# Patient Record
Sex: Male | Born: 1955 | Race: White | Hispanic: No | Marital: Married | Smoking: Former smoker
Health system: Southern US, Community
[De-identification: ages and names within clinical notes are randomized; demographics above are authoritative.]

## PROBLEM LIST (undated history)

## (undated) DIAGNOSIS — Z720 Tobacco use: Secondary | ICD-10-CM

## (undated) DIAGNOSIS — Z7289 Other problems related to lifestyle: Secondary | ICD-10-CM

## (undated) DIAGNOSIS — Z87891 Personal history of nicotine dependence: Secondary | ICD-10-CM

## (undated) DIAGNOSIS — F1090 Alcohol use, unspecified, uncomplicated: Secondary | ICD-10-CM

## (undated) HISTORY — PX: VASECTOMY: SHX75

---

## 1998-04-04 ENCOUNTER — Emergency Department (HOSPITAL_COMMUNITY): Admission: EM | Admit: 1998-04-04 | Discharge: 1998-04-04 | Payer: Self-pay | Admitting: Emergency Medicine

## 2011-05-21 DEATH — deceased

## 2020-05-21 ENCOUNTER — Emergency Department (HOSPITAL_COMMUNITY): Payer: HRSA Program

## 2020-05-21 ENCOUNTER — Other Ambulatory Visit: Payer: Self-pay

## 2020-05-21 ENCOUNTER — Inpatient Hospital Stay (HOSPITAL_COMMUNITY)
Admission: EM | Admit: 2020-05-21 | Discharge: 2020-05-26 | DRG: 177 | Disposition: A | Discharge status: Home / Self Care | Payer: HRSA Program | Attending: Internal Medicine | Admitting: Internal Medicine

## 2020-05-21 DIAGNOSIS — R32 Unspecified urinary incontinence: Secondary | ICD-10-CM | POA: Diagnosis present

## 2020-05-21 DIAGNOSIS — E860 Dehydration: Secondary | ICD-10-CM | POA: Diagnosis present

## 2020-05-21 DIAGNOSIS — N2889 Other specified disorders of kidney and ureter: Secondary | ICD-10-CM | POA: Diagnosis present

## 2020-05-21 DIAGNOSIS — E43 Unspecified severe protein-calorie malnutrition: Secondary | ICD-10-CM | POA: Diagnosis present

## 2020-05-21 DIAGNOSIS — R0602 Shortness of breath: Secondary | ICD-10-CM

## 2020-05-21 DIAGNOSIS — R197 Diarrhea, unspecified: Secondary | ICD-10-CM | POA: Diagnosis present

## 2020-05-21 DIAGNOSIS — R7401 Elevation of levels of liver transaminase levels: Secondary | ICD-10-CM | POA: Diagnosis present

## 2020-05-21 DIAGNOSIS — E8809 Other disorders of plasma-protein metabolism, not elsewhere classified: Secondary | ICD-10-CM | POA: Diagnosis present

## 2020-05-21 DIAGNOSIS — E878 Other disorders of electrolyte and fluid balance, not elsewhere classified: Secondary | ICD-10-CM | POA: Diagnosis present

## 2020-05-21 DIAGNOSIS — G9341 Metabolic encephalopathy: Secondary | ICD-10-CM | POA: Diagnosis present

## 2020-05-21 DIAGNOSIS — N179 Acute kidney failure, unspecified: Secondary | ICD-10-CM | POA: Diagnosis present

## 2020-05-21 DIAGNOSIS — E0781 Sick-euthyroid syndrome: Secondary | ICD-10-CM | POA: Diagnosis present

## 2020-05-21 DIAGNOSIS — Z87891 Personal history of nicotine dependence: Secondary | ICD-10-CM

## 2020-05-21 DIAGNOSIS — R0902 Hypoxemia: Secondary | ICD-10-CM

## 2020-05-21 DIAGNOSIS — R778 Other specified abnormalities of plasma proteins: Secondary | ICD-10-CM | POA: Diagnosis present

## 2020-05-21 DIAGNOSIS — R7989 Other specified abnormal findings of blood chemistry: Secondary | ICD-10-CM | POA: Diagnosis present

## 2020-05-21 DIAGNOSIS — U071 COVID-19: Secondary | ICD-10-CM

## 2020-05-21 DIAGNOSIS — J1282 Pneumonia due to coronavirus disease 2019: Secondary | ICD-10-CM | POA: Diagnosis present

## 2020-05-21 DIAGNOSIS — J9601 Acute respiratory failure with hypoxia: Secondary | ICD-10-CM | POA: Diagnosis present

## 2020-05-21 DIAGNOSIS — N1832 Chronic kidney disease, stage 3b: Secondary | ICD-10-CM | POA: Diagnosis present

## 2020-05-21 DIAGNOSIS — D509 Iron deficiency anemia, unspecified: Secondary | ICD-10-CM | POA: Diagnosis present

## 2020-05-21 DIAGNOSIS — N1330 Unspecified hydronephrosis: Secondary | ICD-10-CM | POA: Diagnosis present

## 2020-05-21 DIAGNOSIS — Z6821 Body mass index (BMI) 21.0-21.9, adult: Secondary | ICD-10-CM

## 2020-05-21 DIAGNOSIS — E871 Hypo-osmolality and hyponatremia: Secondary | ICD-10-CM | POA: Diagnosis present

## 2020-05-21 DIAGNOSIS — R319 Hematuria, unspecified: Secondary | ICD-10-CM | POA: Diagnosis present

## 2020-05-21 HISTORY — DX: Tobacco use: Z72.0

## 2020-05-21 HISTORY — DX: Alcohol use, unspecified, uncomplicated: F10.90

## 2020-05-21 HISTORY — DX: Other problems related to lifestyle: Z72.89

## 2020-05-21 HISTORY — DX: Personal history of nicotine dependence: Z87.891

## 2020-05-21 LAB — COMPREHENSIVE METABOLIC PANEL
ALT: 44 U/L (ref 0–44)
AST: 71 U/L — ABNORMAL HIGH (ref 15–41)
Albumin: 2.4 g/dL — ABNORMAL LOW (ref 3.5–5.0)
Alkaline Phosphatase: 36 U/L — ABNORMAL LOW (ref 38–126)
Anion gap: 13 (ref 5–15)
BUN: 47 mg/dL — ABNORMAL HIGH (ref 8–23)
CO2: 22 mmol/L (ref 22–32)
Calcium: 8.7 mg/dL — ABNORMAL LOW (ref 8.9–10.3)
Chloride: 94 mmol/L — ABNORMAL LOW (ref 98–111)
Creatinine, Ser: 2.63 mg/dL — ABNORMAL HIGH (ref 0.61–1.24)
GFR calc Af Amer: 28 mL/min — ABNORMAL LOW (ref >60)
GFR calc non Af Amer: 24 mL/min — ABNORMAL LOW (ref >60)
Glucose, Bld: 111 mg/dL — ABNORMAL HIGH (ref 70–99)
Potassium: 4.4 mmol/L (ref 3.5–5.1)
Sodium: 129 mmol/L — ABNORMAL LOW (ref 135–145)
Total Bilirubin: 0.9 mg/dL (ref 0.3–1.2)
Total Protein: 6.7 g/dL (ref 6.5–8.1)

## 2020-05-21 LAB — SARS CORONAVIRUS 2 BY RT PCR (HOSPITAL ORDER, PERFORMED IN ~~LOC~~ HOSPITAL LAB): SARS Coronavirus 2: POSITIVE — AB

## 2020-05-21 LAB — TROPONIN I (HIGH SENSITIVITY)
Troponin I (High Sensitivity): 26 ng/L — ABNORMAL HIGH (ref <18)
Troponin I (High Sensitivity): 30 ng/L — ABNORMAL HIGH (ref <18)

## 2020-05-21 NOTE — ED Notes (Signed)
Patient moved to appropriate waiting section

## 2020-05-21 NOTE — ED Triage Notes (Signed)
Patient concerned for Covid; he's been having cough and throat pain since 08/05; symptoms resolved and comes back.

## 2020-05-21 NOTE — ED Notes (Signed)
Patient stated his wife has given him some sort of OTC meds today, unknown if recently took tylenol.

## 2020-05-22 ENCOUNTER — Inpatient Hospital Stay (HOSPITAL_COMMUNITY): Payer: HRSA Program

## 2020-05-22 ENCOUNTER — Encounter (HOSPITAL_COMMUNITY): Payer: Self-pay | Admitting: Internal Medicine

## 2020-05-22 DIAGNOSIS — Z87891 Personal history of nicotine dependence: Secondary | ICD-10-CM | POA: Diagnosis not present

## 2020-05-22 DIAGNOSIS — J1282 Pneumonia due to coronavirus disease 2019: Secondary | ICD-10-CM

## 2020-05-22 DIAGNOSIS — D509 Iron deficiency anemia, unspecified: Secondary | ICD-10-CM

## 2020-05-22 DIAGNOSIS — E878 Other disorders of electrolyte and fluid balance, not elsewhere classified: Secondary | ICD-10-CM | POA: Diagnosis present

## 2020-05-22 DIAGNOSIS — J9601 Acute respiratory failure with hypoxia: Secondary | ICD-10-CM

## 2020-05-22 DIAGNOSIS — E871 Hypo-osmolality and hyponatremia: Secondary | ICD-10-CM | POA: Diagnosis present

## 2020-05-22 DIAGNOSIS — N390 Urinary tract infection, site not specified: Secondary | ICD-10-CM

## 2020-05-22 DIAGNOSIS — E8809 Other disorders of plasma-protein metabolism, not elsewhere classified: Secondary | ICD-10-CM | POA: Diagnosis present

## 2020-05-22 DIAGNOSIS — E0781 Sick-euthyroid syndrome: Secondary | ICD-10-CM | POA: Diagnosis present

## 2020-05-22 DIAGNOSIS — R778 Other specified abnormalities of plasma proteins: Secondary | ICD-10-CM | POA: Diagnosis present

## 2020-05-22 DIAGNOSIS — R32 Unspecified urinary incontinence: Secondary | ICD-10-CM | POA: Diagnosis present

## 2020-05-22 DIAGNOSIS — R7989 Other specified abnormal findings of blood chemistry: Secondary | ICD-10-CM | POA: Diagnosis not present

## 2020-05-22 DIAGNOSIS — R319 Hematuria, unspecified: Secondary | ICD-10-CM | POA: Diagnosis present

## 2020-05-22 DIAGNOSIS — U071 COVID-19: Secondary | ICD-10-CM | POA: Diagnosis present

## 2020-05-22 DIAGNOSIS — E860 Dehydration: Secondary | ICD-10-CM | POA: Diagnosis present

## 2020-05-22 DIAGNOSIS — N1832 Chronic kidney disease, stage 3b: Secondary | ICD-10-CM | POA: Diagnosis present

## 2020-05-22 DIAGNOSIS — N179 Acute kidney failure, unspecified: Secondary | ICD-10-CM | POA: Diagnosis present

## 2020-05-22 DIAGNOSIS — R7401 Elevation of levels of liver transaminase levels: Secondary | ICD-10-CM

## 2020-05-22 DIAGNOSIS — N1330 Unspecified hydronephrosis: Secondary | ICD-10-CM | POA: Diagnosis present

## 2020-05-22 DIAGNOSIS — N2889 Other specified disorders of kidney and ureter: Secondary | ICD-10-CM | POA: Diagnosis present

## 2020-05-22 DIAGNOSIS — I5031 Acute diastolic (congestive) heart failure: Secondary | ICD-10-CM | POA: Diagnosis not present

## 2020-05-22 DIAGNOSIS — R197 Diarrhea, unspecified: Secondary | ICD-10-CM | POA: Diagnosis present

## 2020-05-22 DIAGNOSIS — Z6821 Body mass index (BMI) 21.0-21.9, adult: Secondary | ICD-10-CM | POA: Diagnosis not present

## 2020-05-22 DIAGNOSIS — E43 Unspecified severe protein-calorie malnutrition: Secondary | ICD-10-CM

## 2020-05-22 DIAGNOSIS — G9341 Metabolic encephalopathy: Secondary | ICD-10-CM | POA: Diagnosis present

## 2020-05-22 LAB — CBC WITH DIFFERENTIAL/PLATELET
Abs Immature Granulocytes: 0 10*3/uL (ref 0.00–0.07)
Basophils Absolute: 0 10*3/uL (ref 0.0–0.1)
Basophils Relative: 0 %
Eosinophils Absolute: 0 10*3/uL (ref 0.0–0.5)
Eosinophils Relative: 0 %
HCT: 33.9 % — ABNORMAL LOW (ref 39.0–52.0)
Hemoglobin: 11 g/dL — ABNORMAL LOW (ref 13.0–17.0)
Lymphocytes Relative: 1 %
Lymphs Abs: 0.1 10*3/uL — ABNORMAL LOW (ref 0.7–4.0)
MCH: 25.6 pg — ABNORMAL LOW (ref 26.0–34.0)
MCHC: 32.4 g/dL (ref 30.0–36.0)
MCV: 78.8 fL — ABNORMAL LOW (ref 80.0–100.0)
Monocytes Absolute: 0.1 10*3/uL (ref 0.1–1.0)
Monocytes Relative: 1 %
Neutro Abs: 6.6 10*3/uL (ref 1.7–7.7)
Neutrophils Relative %: 98 %
Platelets: 358 10*3/uL (ref 150–400)
RBC: 4.3 MIL/uL (ref 4.22–5.81)
RDW: 15.4 % (ref 11.5–15.5)
WBC: 6.7 10*3/uL (ref 4.0–10.5)
nRBC: 0 % (ref 0.0–0.2)
nRBC: 0 /100 WBC

## 2020-05-22 LAB — LACTATE DEHYDROGENASE: LDH: 404 U/L — ABNORMAL HIGH (ref 98–192)

## 2020-05-22 LAB — FIBRINOGEN: Fibrinogen: 792 mg/dL — ABNORMAL HIGH (ref 210–475)

## 2020-05-22 LAB — LACTIC ACID, PLASMA: Lactic Acid, Venous: 1.7 mmol/L (ref 0.5–1.9)

## 2020-05-22 LAB — URINALYSIS, ROUTINE W REFLEX MICROSCOPIC
Bilirubin Urine: NEGATIVE
Glucose, UA: NEGATIVE mg/dL
Ketones, ur: 5 mg/dL — AB
Leukocytes,Ua: NEGATIVE
Nitrite: NEGATIVE
Protein, ur: 100 mg/dL — AB
RBC / HPF: >50 RBC/hpf — ABNORMAL HIGH (ref 0–5)
Specific Gravity, Urine: 1.013 (ref 1.005–1.030)
pH: 5 (ref 5.0–8.0)

## 2020-05-22 LAB — ABO/RH: ABO/RH(D): O POS

## 2020-05-22 LAB — HIV ANTIBODY (ROUTINE TESTING W REFLEX): HIV Screen 4th Generation wRfx: NONREACTIVE

## 2020-05-22 LAB — STREP PNEUMONIAE URINARY ANTIGEN: Strep Pneumo Urinary Antigen: NEGATIVE

## 2020-05-22 LAB — SODIUM, URINE, RANDOM: Sodium, Ur: 12 mmol/L

## 2020-05-22 LAB — CK: Total CK: 1116 U/L — ABNORMAL HIGH (ref 49–397)

## 2020-05-22 LAB — FERRITIN: Ferritin: 967 ng/mL — ABNORMAL HIGH (ref 24–336)

## 2020-05-22 LAB — D-DIMER, QUANTITATIVE: D-Dimer, Quant: 4.31 ug/mL-FEU — ABNORMAL HIGH (ref 0.00–0.50)

## 2020-05-22 LAB — PROCALCITONIN: Procalcitonin: 1.38 ng/mL

## 2020-05-22 LAB — C-REACTIVE PROTEIN: CRP: 22.5 mg/dL — ABNORMAL HIGH (ref <1.0)

## 2020-05-22 LAB — HEPATITIS B SURFACE ANTIGEN: Hepatitis B Surface Ag: NONREACTIVE

## 2020-05-22 LAB — CREATININE, URINE, RANDOM: Creatinine, Urine: 158.38 mg/dL

## 2020-05-22 LAB — BRAIN NATRIURETIC PEPTIDE: B Natriuretic Peptide: 62.9 pg/mL (ref 0.0–100.0)

## 2020-05-22 LAB — PREALBUMIN: Prealbumin: <5 mg/dL — ABNORMAL LOW (ref 18–38)

## 2020-05-22 MED ORDER — ZINC SULFATE 220 (50 ZN) MG PO CAPS
220.0000 mg | ORAL_CAPSULE | Freq: Every day | ORAL | Status: DC
  Administered 2020-05-22 – 2020-05-26 (×5): 220 mg via ORAL
  Filled 2020-05-22 (×5): qty 1

## 2020-05-22 MED ORDER — HEPARIN SODIUM (PORCINE) 5000 UNIT/ML IJ SOLN
5000.0000 [IU] | Freq: Three times a day (TID) | INTRAMUSCULAR | Status: DC
  Administered 2020-05-22 – 2020-05-26 (×14): 5000 [IU] via SUBCUTANEOUS
  Filled 2020-05-22 (×15): qty 1

## 2020-05-22 MED ORDER — AZITHROMYCIN 500 MG PO TABS
500.0000 mg | ORAL_TABLET | Freq: Every day | ORAL | Status: DC
  Administered 2020-05-22 – 2020-05-24 (×3): 500 mg via ORAL
  Filled 2020-05-22 (×2): qty 1
  Filled 2020-05-22: qty 2

## 2020-05-22 MED ORDER — GUAIFENESIN-DM 100-10 MG/5ML PO SYRP: 10.0000 mL | ORAL_SOLUTION | ORAL | Status: DC | PRN

## 2020-05-22 MED ORDER — FAMOTIDINE IN NACL 20-0.9 MG/50ML-% IV SOLN: 20.0000 mg | Freq: Once | INTRAVENOUS | Status: DC | PRN

## 2020-05-22 MED ORDER — ONDANSETRON HCL 4 MG PO TABS: 4.0000 mg | ORAL_TABLET | Freq: Four times a day (QID) | ORAL | Status: DC | PRN

## 2020-05-22 MED ORDER — ALBUTEROL SULFATE HFA 108 (90 BASE) MCG/ACT IN AERS
4.0000 | INHALATION_SPRAY | Freq: Once | RESPIRATORY_TRACT | Status: AC
  Administered 2020-05-22: 4 via RESPIRATORY_TRACT
  Filled 2020-05-22: qty 6.7

## 2020-05-22 MED ORDER — SODIUM CHLORIDE 0.9 % IV SOLN: INTRAVENOUS | Status: DC | PRN

## 2020-05-22 MED ORDER — ALBUTEROL SULFATE HFA 108 (90 BASE) MCG/ACT IN AERS: 2.0000 | INHALATION_SPRAY | Freq: Once | RESPIRATORY_TRACT | Status: DC | PRN

## 2020-05-22 MED ORDER — METHYLPREDNISOLONE SODIUM SUCC 125 MG IJ SOLR: 125.0000 mg | Freq: Once | INTRAMUSCULAR | Status: DC | PRN

## 2020-05-22 MED ORDER — LACTATED RINGERS IV BOLUS
1000.0000 mL | Freq: Once | INTRAVENOUS | Status: AC
  Administered 2020-05-22: 1000 mL via INTRAVENOUS

## 2020-05-22 MED ORDER — SODIUM CHLORIDE 0.9 % IV SOLN
100.0000 mg | Freq: Every day | INTRAVENOUS | Status: AC
  Administered 2020-05-23 – 2020-05-26 (×4): 100 mg via INTRAVENOUS
  Filled 2020-05-22 (×5): qty 20

## 2020-05-22 MED ORDER — ACETAMINOPHEN 325 MG PO TABS: 650.0000 mg | ORAL_TABLET | Freq: Four times a day (QID) | ORAL | Status: DC | PRN

## 2020-05-22 MED ORDER — SODIUM CHLORIDE 0.9 % IV SOLN
2.0000 g | INTRAVENOUS | Status: DC
  Administered 2020-05-22 – 2020-05-24 (×3): 2 g via INTRAVENOUS
  Filled 2020-05-22 (×3): qty 20

## 2020-05-22 MED ORDER — SODIUM CHLORIDE 0.9 % IV SOLN: Freq: Once | INTRAVENOUS | Status: AC

## 2020-05-22 MED ORDER — ONDANSETRON HCL 4 MG/2ML IJ SOLN: 4.0000 mg | Freq: Four times a day (QID) | INTRAMUSCULAR | Status: DC | PRN

## 2020-05-22 MED ORDER — METHYLPREDNISOLONE SODIUM SUCC 40 MG IJ SOLR
0.2500 mg/kg | Freq: Two times a day (BID) | INTRAMUSCULAR | Status: DC
  Administered 2020-05-22 (×2): 17.2 mg via INTRAVENOUS
  Filled 2020-05-22 (×2): qty 1

## 2020-05-22 MED ORDER — SODIUM CHLORIDE 0.9 % IV SOLN
200.0000 mg | Freq: Once | INTRAVENOUS | Status: AC
  Administered 2020-05-22: 200 mg via INTRAVENOUS
  Filled 2020-05-22: qty 40

## 2020-05-22 MED ORDER — HYDROCOD POLST-CPM POLST ER 10-8 MG/5ML PO SUER: 5.0000 mL | Freq: Two times a day (BID) | ORAL | Status: DC | PRN

## 2020-05-22 MED ORDER — FAMOTIDINE 20 MG PO TABS
20.0000 mg | ORAL_TABLET | Freq: Two times a day (BID) | ORAL | Status: DC
  Administered 2020-05-22 – 2020-05-23 (×3): 20 mg via ORAL
  Filled 2020-05-22 (×3): qty 1

## 2020-05-22 MED ORDER — SODIUM CHLORIDE 0.9% FLUSH
3.0000 mL | Freq: Two times a day (BID) | INTRAVENOUS | Status: DC
  Administered 2020-05-22 – 2020-05-26 (×8): 3 mL via INTRAVENOUS

## 2020-05-22 MED ORDER — DIPHENHYDRAMINE HCL 50 MG/ML IJ SOLN: 50.0000 mg | Freq: Once | INTRAMUSCULAR | Status: DC | PRN

## 2020-05-22 MED ORDER — ALBUTEROL SULFATE HFA 108 (90 BASE) MCG/ACT IN AERS
2.0000 | INHALATION_SPRAY | Freq: Four times a day (QID) | RESPIRATORY_TRACT | Status: DC
  Administered 2020-05-22 – 2020-05-26 (×17): 2 via RESPIRATORY_TRACT
  Filled 2020-05-22 (×2): qty 6.7

## 2020-05-22 MED ORDER — ENSURE ENLIVE PO LIQD
237.0000 mL | Freq: Two times a day (BID) | ORAL | Status: DC
  Administered 2020-05-22 – 2020-05-25 (×5): 237 mL via ORAL
  Filled 2020-05-22: qty 237

## 2020-05-22 MED ORDER — EPINEPHRINE 0.3 MG/0.3ML IJ SOAJ: 0.3000 mg | Freq: Once | INTRAMUSCULAR | Status: DC | PRN

## 2020-05-22 MED ORDER — SODIUM CHLORIDE 0.9 % IV SOLN
1200.0000 mg | Freq: Once | INTRAVENOUS | Status: AC
  Administered 2020-05-22: 1200 mg via INTRAVENOUS
  Filled 2020-05-22: qty 10

## 2020-05-22 MED ORDER — ASCORBIC ACID 500 MG PO TABS
500.0000 mg | ORAL_TABLET | Freq: Every day | ORAL | Status: DC
  Administered 2020-05-22 – 2020-05-26 (×5): 500 mg via ORAL
  Filled 2020-05-22 (×5): qty 1

## 2020-05-22 MED ORDER — TECHNETIUM TO 99M ALBUMIN AGGREGATED
4.4000 | Freq: Once | INTRAVENOUS | Status: AC | PRN
  Administered 2020-05-22: 4.4 via INTRAVENOUS

## 2020-05-22 NOTE — Progress Notes (Addendum)
Renal ultrasound revealed signs of a left-sided renal mass with signs of hydronephrosis.  Discussed case with Dr. Gloriann Loan who recommended starting out with placing a Foley catheter as there was concern for some bladder distention.  If no improvement thereafter would recommended obtaining CT scan of the abdomen pelvis without contrast.  In regards to the renal mass nothing to do in the acute setting, but ultimately will need outpatient follow-up, CT/MRI, and nephrectomy .  Wife and patient updated in regards to these acute finding. CK was elevated up 1116 concern for mild rhabdomyolysis.  Currently only giving gentle IV fluid rehydration given COVID-19 status.  Recheck CK in a.m.

## 2020-05-22 NOTE — Progress Notes (Signed)
I was called for recommendations in regards to this patient.  Patient currently being admitted with COVID-19.  He was found to have acute renal insufficiency with a creatinine of 2.63 and therefore a renal ultrasound was performed.  His main complaints are related to COVID-19 including worsening cough and weakness and was treating himself with ivermectin.  He was also fasting without much p.o. intake.  I was personally called in relation to his renal ultrasound which revealed minimal right hydronephrosis and prominence of the proximal ureter in the left kidney, there was a heterogeneous solid mass measuring 11.2 x 9.4 x 12 cm with mild to moderate left hydronephrosis and proximal hydroureter with heterogeneous fluid in the renal collecting system potentially consistent with blood product from this large renal mass.  He also had increased renal parenchymal echogenicity.  The bladder appeared distended with debris in the bladder.  I would currently recommend placement of Foley catheter and observation of his renal function while he is treated for COVID-19.  No acute intervention necessary for the large renal mass however, once he has improved, he will need dedicated renal imaging either with a CT or an MRI.  Continue to monitor renal function with Foley in place and once he clinically improves, recommend voiding trial.  Please call with any questions or concerns.  I would hold off on CT for now unless renal function worsens.

## 2020-05-22 NOTE — ED Provider Notes (Signed)
[name] Provider Note   CSN: [id number] Arrival date & time: 05/21/20  1823     History Chief Complaint  Patient presents with  . Cough    COVID+    [name] is a 64 y.o. male.   Cough Cough characteristics:  Dry and harsh Severity:  Moderate Onset quality:  Gradual Duration:  2 days Timing:  Constant Progression:  Worsening Chronicity:  New Smoker: previous.   Context: sick contacts and upper respiratory infection   Context: not with activity   Relieved by:  Cough suppressants Worsened by:  Nothing Ineffective treatments:  None tried Associated symptoms: chest pain, chills, fever, myalgias and shortness of breath      No past medical history on file.  Patient Active Problem List   Diagnosis Date Noted  . Pneumonia due to COVID-19 virus 05/22/2020    No family history on file.  Social History   Tobacco Use  . Smoking status: Not on file  Substance Use Topics  . Alcohol use: Not on file  . Drug use: Not on file    Home Medications Prior to Admission medications   Not on File    Allergies    Patient has no known allergies.  Review of Systems   Review of Systems  Constitutional: Positive for chills and fever.  Respiratory: Positive for cough and shortness of breath.   Cardiovascular: Positive for chest pain.  Musculoskeletal: Positive for myalgias.  All other systems reviewed and are negative.   Physical Exam Updated Vital Signs BP 117/65 (BP Location: Left Arm)   Pulse 84   Temp 98.8 F (37.1 C)   Resp (!) 22   SpO2 94%   Physical Exam Vitals and nursing note reviewed.  Constitutional:      Appearance: He is well-developed.  HENT:     Head: Normocephalic and atraumatic.     Nose: Nose normal. No congestion or rhinorrhea.     Mouth/Throat:     Mouth: Mucous membranes are moist.  Eyes:     Pupils: Pupils are equal, round, and reactive to light.  Cardiovascular:     Rate and Rhythm:  Normal rate.  Pulmonary:     Effort: Pulmonary effort is normal. Tachypnea present. No respiratory distress.     Breath sounds: Rales present.  Abdominal:     General: There is no distension.  Musculoskeletal:        General: No swelling or tenderness. Normal range of motion.     Cervical back: Normal range of motion.  Skin:    General: Skin is warm and dry.  Neurological:     General: No focal deficit present.     Mental Status: He is alert.     ED Results / Procedures / Treatments   Labs (all labs ordered are listed, but only abnormal results are displayed) Labs Reviewed  SARS CORONAVIRUS 2 BY RT PCR ([facility]) - Abnormal; Notable for the following components:      Result Value   SARS Coronavirus 2 POSITIVE (*)    All other components within normal limits  CBC WITH DIFFERENTIAL/PLATELET - Abnormal; Notable for the following components:   Hemoglobin 11.0 (*)    HCT 33.9 (*)    MCV 78.8 (*)    MCH 25.6 (*)    Lymphs Abs 0.1 (*)    All other components within normal limits  COMPREHENSIVE METABOLIC PANEL - Abnormal; Notable for the following  components:   Sodium 129 (*)    Chloride 94 (*)    Glucose, Bld 111 (*)    BUN 47 (*)    Creatinine, Ser 2.63 (*)    Calcium 8.7 (*)    Albumin 2.4 (*)    AST 71 (*)    Alkaline Phosphatase 36 (*)    GFR calc non Af Amer 24 (*)    GFR calc Af Amer 28 (*)    All other components within normal limits  TROPONIN I (HIGH SENSITIVITY) - Abnormal; Notable for the following components:   Troponin I (High Sensitivity) 26 (*)    All other components within normal limits  TROPONIN I (HIGH SENSITIVITY) - Abnormal; Notable for the following components:   Troponin I (High Sensitivity) 30 (*)    All other components within normal limits    EKG None  Radiology DG Chest Portable 1 View  Result Date: 05/21/2020 CLINICAL DATA:  Cough and throat pain. COVID positive. EXAM: PORTABLE CHEST 1 VIEW  COMPARISON:  None. FINDINGS: Very mild hazy areas of early infiltrate and/or atelectasis are seen along the periphery of the mid and lower left lung there is no evidence of a pleural effusion or pneumothorax. The heart size and mediastinal contours are within normal limits. The visualized skeletal structures are unremarkable. IMPRESSION: Very mild mid and lower left lung infiltrate and/or atelectasis. Electronically Signed   By: Virgina Norfolk M.D.   On: 05/21/2020 23:06    Procedures .Critical Care Performed by: Merrily Pew, MD Authorized by: Merrily Pew, MD   Critical care provider statement:    Critical care time (minutes):  45   Critical care was necessary to treat or prevent imminent or life-threatening deterioration of the following conditions:  Respiratory failure   Critical care was time spent personally by me on the following activities:  Discussions with consultants, evaluation of patient's response to treatment, examination of patient, ordering and performing treatments and interventions, ordering and review of laboratory studies, ordering and review of radiographic studies, pulse oximetry, re-evaluation of patient's condition, obtaining history from patient or surrogate and review of old charts   (including critical care time)  Medications Ordered in ED Medications  0.9 %  sodium chloride infusion (has no administration in time range)  diphenhydrAMINE (BENADRYL) injection 50 mg (has no administration in time range)  famotidine (PEPCID) IVPB 20 mg premix (has no administration in time range)  methylPREDNISolone sodium succinate (SOLU-MEDROL) 125 mg/2 mL injection 125 mg (has no administration in time range)  albuterol (VENTOLIN HFA) 108 (90 Base) MCG/ACT inhaler 2 puff (has no administration in time range)  EPINEPHrine (EPI-PEN) injection 0.3 mg (has no administration in time range)  lactated ringers bolus 1,000 mL (0 mLs Intravenous Stopped 05/22/20 0648)  albuterol (VENTOLIN  HFA) 108 (90 Base) MCG/ACT inhaler 4 puff (4 puffs Inhalation Given 05/22/20 0542)  casirivimab-imdevimab (REGEN-COV) 1,200 mg in sodium chloride 0.9 % 110 mL IVPB (0 mg Intravenous Stopped 05/22/20 4098)    ED Course  I have reviewed the triage vital signs and the nursing notes.  Pertinent labs & imaging results that were available during my care of the patient were reviewed by me and considered in my medical decision making (see chart for details).    MDM Rules/Calculators/A&P                          Patient is unvaccinated and has Covid.  He is tachypneic not speak in full  sentences, oxygen as low as 89% while speaking 92% at rest.  Has rales in his lungs.  He has elevated troponin likely secondary to creatinine disease but also there is no baseline to draw either 1 of those from.  His kidney function is significantly reduced but once again I do not know what the chronicity of that is.  I discussed options with him for treatment.  Patient is hesitant to be admitted to the hospital as he wants to take Plaquenil at home.  I advised him this is not a suggested regimen for Covid however he is skeptical of my suggestion.  He does agree to try some albuterol, fluids and monoclonal antibody infusion here in the emergency room depending how he feels and his response to that he will decide on whether he wants to be admitted or discharged but he is leaning towards discharge.  I discussed with him how early this was in his illness and how much he could worsen and he understands that while making these decisions. Will recheck later for ultimate disposition.   On reevaluation with his wife on speaker phone patient finally agrees to be admitted.  I tried take his oxygen off and he got significantly more tachypneic and his oxygen pretty much immediately dropped to the low nineties dependent 8988 range at rest while talking.  He understood that this is a severe problem now and is willing to stay for treatment.  Also  his wife states that he was very lethargic and seemed altered.  Final Clinical Impression(s) / ED Diagnoses Final diagnoses:  COVID-19  Hypoxia  Acute renal failure, unspecified acute renal failure type [facility])    Rx / DC Orders ED Discharge Orders    None       Garry Bochicchio, Corene Cornea, MD 05/22/20 [phone]

## 2020-05-22 NOTE — H&P (Addendum)
History and Physical    [name] PXT:[id number] DOB: [birth year] DOA: 05/21/2020  Referring MD/NP/PA: Merrily Pew, MD PCP: Patient, No Pcp Per  Patient coming from: Home  Chief Complaint: Cough and weakness  I have personally briefly reviewed patient's old medical records in [place]   HPI: [name] is a 63 y.o. male with  past medical history significant for alcohol and vape use presented with complaints of worsening cough and weakness over the last 5-6 week.  Patient seems to be somewhat of a poor historian when it comes to the exact dates time and his wife feels in blanks over the phone.  He had not received any of his COVID-19 vaccinations.  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  He complains of having a productive cough for which she had been using Mucinex intermittently without any improvement in symptoms.  Reports having blood in his urine, bowel and urinary  incontinence, left buttock pain, fell at least once, confused, and diarrhea.  He reports taking ivermectin 1 ml for 2 days and had also been fasting as a form of agent remedy.  Per his wife he had been more confused and his oxygen levels were dropping down to 86% at home which she brought him to the hospital for further evaluation.  Normally drinks 1 or 2 beers per day on average, but has not had a drink since he has been sick.  ED Course: Upon admission into the emergency department patient was noted to be febrile up to 101.9 F with mild tachypnea and O2 saturations noted to be as low as 89% while speaking which he was placed on 2 L of nasal cannula oxygen.  Labs significant for hemoglobin 11, sodium 129, BUN 47, creatinine 2.67, albumin 2.4, and AST 71.  Chest x-ray revealed mild mid and lower infiltrates or atelectasis on the left lung.  Plan was initially to possibly discharge patient home and he was given Regen-cov,1 L normal saline IV  fluids, and albuterol inhaler.  However, due to patient's respiratory status admission was recommended.  Review of Systems  Constitutional: Positive for chills and fever.  HENT: Positive for congestion. Negative for nosebleeds.   Eyes: Negative for photophobia and pain.  Respiratory: Positive for cough and shortness of breath. Negative for wheezing.   Cardiovascular: Positive for chest pain. Negative for leg swelling.  Gastrointestinal: Positive for diarrhea. Negative for nausea.  Genitourinary: Positive for hematuria. Negative for flank pain.  Musculoskeletal: Positive for falls and myalgias.  Skin: Negative for itching.  Neurological: Positive for weakness. Negative for focal weakness.  Psychiatric/Behavioral: Positive for substance abuse.       Positive for confusion    Past Medical History:  Diagnosis Date  . Alcohol use   . History of tobacco use   . Vapes nicotine containing substance     Past Surgical History:  Procedure Laterality Date  . VASECTOMY       reports that he quit smoking about 6 years ago. His smoking use included cigarettes. He has never used smokeless tobacco. He reports current alcohol use of about 14.0 standard drinks of alcohol per week. No history on file for drug use.  No Known Allergies  History reviewed. No pertinent family history as he reports family was very secretive regarding medical problems.  Prior to Admission medications   Not on File    Physical Exam:  Constitutional: Elderly male who  appears to be acutely ill Vitals:   05/22/20 0338 05/22/20 0543 05/22/20 0621 05/22/20 0648  BP: 105/64 112/67 110/75 117/65  Pulse: 89 89 94 84  Resp: 18 20 16  (!) 22  Temp:    98.8 F (37.1 C)  TempSrc:      SpO2: 93% 94% 96% 94%   Eyes: PERRL, lids and conjunctivae normal ENMT: Mucous membranes are dry.  Posterior pharynx clear of any exudate or lesions.  Neck: normal, supple, no masses, no thyromegaly Respiratory: Tachypneic with scattered  crackles appreciated in both lung fields.  Patient currently on 3 L nasal cannula oxygen with O2 saturations currently 94%. Cardiovascular: Regular rate and rhythm, no murmurs / rubs / gallops. No extremity edema. 2+ pedal pulses. No carotid bruits.  Abdomen: no tenderness, no masses palpated. No hepatosplenomegaly. Bowel sounds positive.  Musculoskeletal: no clubbing / cyanosis. No joint deformity upper and lower extremities. Good ROM, no contractures. Normal muscle tone.  Skin: no rashes, lesions, ulcers. No induration Neurologic: CN 2-12 grossly intact. Sensation intact, DTR normal. Strength 5/5 in all 4.  Psychiatric: Alert and oriented x person and place.  He does appear to be intermittently confused.    Labs on Admission: I have personally reviewed following labs and imaging studies  CBC: Recent Labs  Lab 05/21/20 1929  WBC 6.7  NEUTROABS 6.6  HGB 11.0*  HCT 33.9*  MCV 78.8*  PLT 665   Basic Metabolic Panel: Recent Labs  Lab 05/21/20 1929  NA 129*  K 4.4  CL 94*  CO2 22  GLUCOSE 111*  BUN 47*  CREATININE 2.63*  CALCIUM 8.7*   GFR: CrCl cannot be calculated (Unknown ideal weight.). Liver Function Tests: Recent Labs  Lab 05/21/20 1929  AST 71*  ALT 44  ALKPHOS 36*  BILITOT 0.9  PROT 6.7  ALBUMIN 2.4*   No results for input(s): LIPASE, AMYLASE in the last 168 hours. No results for input(s): AMMONIA in the last 168 hours. Coagulation Profile: No results for input(s): INR, PROTIME in the last 168 hours. Cardiac Enzymes: No results for input(s): CKTOTAL, CKMB, CKMBINDEX, TROPONINI in the last 168 hours. BNP (last 3 results) No results for input(s): PROBNP in the last 8760 hours. HbA1C: No results for input(s): HGBA1C in the last 72 hours. CBG: No results for input(s): GLUCAP in the last 168 hours. Lipid Profile: No results for input(s): CHOL, HDL, LDLCALC, TRIG, CHOLHDL, LDLDIRECT in the last 72 hours. Thyroid Function Tests: No results for input(s):  TSH, T4TOTAL, FREET4, T3FREE, THYROIDAB in the last 72 hours. Anemia Panel: No results for input(s): VITAMINB12, FOLATE, FERRITIN, TIBC, IRON, RETICCTPCT in the last 72 hours. Urine analysis: No results found for: COLORURINE, APPEARANCEUR, LABSPEC, PHURINE, GLUCOSEU, HGBUR, BILIRUBINUR, KETONESUR, PROTEINUR, UROBILINOGEN, NITRITE, LEUKOCYTESUR Sepsis Labs: Recent Results (from the past 240 hour(s))  SARS Coronavirus 2 by RT PCR (hospital order, performed in [facility] hospital lab) Nasopharyngeal Nasopharyngeal Swab     Status: Abnormal   Collection Time: 05/21/20  7:15 PM   Specimen: Nasopharyngeal Swab  Result Value Ref Range Status   SARS Coronavirus 2 POSITIVE (A) NEGATIVE Final    Comment: RESULT CALLED TO, READ BACK BY AND VERIFIED WITH: K,MUNNETT RN @2109  05/21/20 EB (NOTE) SARS-CoV-2 target nucleic acids are DETECTED  SARS-CoV-2 RNA is generally detectable in upper respiratory specimens  during the acute phase of infection.  Positive results are indicative  of the presence of the identified virus, but do not rule out bacterial infection or co-infection with other pathogens  not detected by the test.  Clinical correlation with patient history and  other diagnostic information is necessary to determine patient infection status.  The expected result is negative.  Fact Sheet for Patients:   StrictlyIdeas.no   Fact Sheet for Healthcare Providers:   BankingDealers.co.za    This test is not yet approved or cleared by the [place] FDA and  has been authorized for detection and/or diagnosis of SARS-CoV-2 by FDA under an Emergency Use Authorization (EUA).  This EUA will remain in effect (meaning this test can  be used) for the duration of  the COVID-19 declaration under Section 564(b)(1) of the Act, 21 U.S.C. section 360-bbb-3(b)(1), unless the authorization is terminated or revoked sooner.  Performed at [facility],  [place] [address] [place], [place] [address]      Radiological Exams on Admission: DG Chest Portable 1 View  Result Date: 05/21/2020 CLINICAL DATA:  Cough and throat pain. COVID positive. EXAM: PORTABLE CHEST 1 VIEW COMPARISON:  None. FINDINGS: Very mild hazy areas of early infiltrate and/or atelectasis are seen along the periphery of the mid and lower left lung there is no evidence of a pleural effusion or pneumothorax. The heart size and mediastinal contours are within normal limits. The visualized skeletal structures are unremarkable. IMPRESSION: Very mild mid and lower left lung infiltrate and/or atelectasis. Electronically Signed   By: Virgina Norfolk M.D.   On: 05/21/2020 23:06    Portable chest x-ray: Independently reviewed.  Developing left infiltrates appreciated  Assessment/Plan Respiratory failure with hypoxia  Pneumonia due to COVID-19 virus: Acute.  Patient presents with complaints of shor cough and found to be COVID-19 positive.  Found to have temperature 101.9 F with tachypnea meeting SIRS criteria.  Initial chest x-ray concerning for left-sided infiltrates.  Initially given monoclonal antibody infusion with plan for possible discharge home, but O2 saturations dropping as low as 88% with with talking.  Patient thereafter agreeable to admission. -Admit to a medical telemetry bed -COVID-19 admission and sepsis order set utilized -Continuous pulse oximetry with nasal cannula oxygen maintaining O2 saturations greater than 90% -Check blood culture and lactic acid  -Check inflammatory markers and monitor daily -Remdesivir day 1 of 5 -Solu-Medrol IV day 1 of 10 -Empiric antibiotics of Rocephin and azithromycin given elevated procalcitonin levels -Albuterol inhaler every 6 hours -Antitussives as needed -Vitamin C and zinc -Tylenol as needed for fever   Acute metabolic encephalopathy: Patient noted to be confused per family. -Neurochecks  Elevated troponin: Acute.  Patient reported  complaints of chest pain on admission.  High-sensitivity troponin 26-> 30. -Check EKG -Follow-up D-dimer -May warrant further investigation   Acute renal failure Hematuria (acute): He complained of having urinary incontinence and blood in urine.  On admission creatinine was 2.63 with BUN 47, but no baseline with which to compare.  Urinalysis was positive for blood.  He had been given 1 L of lactated Ringer's in the emergency department. -Avoid nephrotoxic agents -Check CK -Check urine creatinine and urine sodium to calculate FeNa -Check renal ultrasound -Normal saline IV fluids at 50 mL/h x 1 L  Severe protein calorie malnutrition: Acute.  Albumin level 2.4 on admission. -Check prealbumin (<5) -Ensure shakes with meals  Hyponatremia/hypochloremia: Acute.  Sodium 129 with chloride 94 on admission.  Suspect symptoms likely related with poor p.o. intake.  He had been given 1 L of normal saline IV fluids.  -Continue to monitor  Microcytic hypochromic anemia: On admission hemoglobin 11 with low MCV and MCH.  Suspect iron deficiency as likely cause of symptoms. -Check iron studies in a.m.  Elevated AST: Acute.  On admission AST mildly elevated at 71 -Continue to monitor  GI prophylaxis: Pepcid DVT prophylaxis: Heparin Code Status: Full Family Communication: Plan of care discussed with the patient's wife over the phone Disposition Plan: Hopefully discharge home in 3 to 5 days depending clinical status Consults called: None Admission status: Inpatient requiring more than 2 midnight stay due to severity of symptoms with COVID-19 requiring inpatient infusions and monitoring for worsening respiratory status  Norval Morton MD [facility] Pager [phone]   If 7PM-7AM, please contact night-coverage [email or web address] Password [facility]  05/22/2020, 7:51 AM

## 2020-05-23 ENCOUNTER — Inpatient Hospital Stay (HOSPITAL_COMMUNITY): Payer: HRSA Program

## 2020-05-23 ENCOUNTER — Encounter (HOSPITAL_COMMUNITY): Payer: Self-pay | Admitting: Internal Medicine

## 2020-05-23 DIAGNOSIS — R7989 Other specified abnormal findings of blood chemistry: Secondary | ICD-10-CM

## 2020-05-23 DIAGNOSIS — U071 COVID-19: Secondary | ICD-10-CM

## 2020-05-23 DIAGNOSIS — J1282 Pneumonia due to coronavirus disease 2019: Secondary | ICD-10-CM | POA: Diagnosis not present

## 2020-05-23 LAB — CREATININE, URINE, RANDOM: Creatinine, Urine: 74.41 mg/dL

## 2020-05-23 LAB — URINALYSIS, ROUTINE W REFLEX MICROSCOPIC
Bilirubin Urine: NEGATIVE
Glucose, UA: NEGATIVE mg/dL
Ketones, ur: NEGATIVE mg/dL
Leukocytes,Ua: NEGATIVE
Nitrite: NEGATIVE
Protein, ur: 30 mg/dL — AB
Specific Gravity, Urine: 1.012 (ref 1.005–1.030)
pH: 5 (ref 5.0–8.0)

## 2020-05-23 LAB — CBC WITH DIFFERENTIAL/PLATELET
Abs Immature Granulocytes: 0.04 10*3/uL (ref 0.00–0.07)
Basophils Absolute: 0 10*3/uL (ref 0.0–0.1)
Basophils Relative: 0 %
Eosinophils Absolute: 0 10*3/uL (ref 0.0–0.5)
Eosinophils Relative: 0 %
HCT: 34 % — ABNORMAL LOW (ref 39.0–52.0)
Hemoglobin: 11.1 g/dL — ABNORMAL LOW (ref 13.0–17.0)
Immature Granulocytes: 1 %
Lymphocytes Relative: 4 %
Lymphs Abs: 0.3 10*3/uL — ABNORMAL LOW (ref 0.7–4.0)
MCH: 25.7 pg — ABNORMAL LOW (ref 26.0–34.0)
MCHC: 32.6 g/dL (ref 30.0–36.0)
MCV: 78.7 fL — ABNORMAL LOW (ref 80.0–100.0)
Monocytes Absolute: 0.1 10*3/uL (ref 0.1–1.0)
Monocytes Relative: 2 %
Neutro Abs: 5.5 10*3/uL (ref 1.7–7.7)
Neutrophils Relative %: 93 %
Platelets: 365 10*3/uL (ref 150–400)
RBC: 4.32 MIL/uL (ref 4.22–5.81)
RDW: 15.3 % (ref 11.5–15.5)
WBC: 5.9 10*3/uL (ref 4.0–10.5)
nRBC: 0 % (ref 0.0–0.2)

## 2020-05-23 LAB — IRON AND TIBC
Iron: 10 ug/dL — ABNORMAL LOW (ref 45–182)
Saturation Ratios: 6 % — ABNORMAL LOW (ref 17.9–39.5)
TIBC: 162 ug/dL — ABNORMAL LOW (ref 250–450)
UIBC: 152 ug/dL

## 2020-05-23 LAB — COMPREHENSIVE METABOLIC PANEL
ALT: 49 U/L — ABNORMAL HIGH (ref 0–44)
AST: 60 U/L — ABNORMAL HIGH (ref 15–41)
Albumin: 2.1 g/dL — ABNORMAL LOW (ref 3.5–5.0)
Alkaline Phosphatase: 34 U/L — ABNORMAL LOW (ref 38–126)
Anion gap: 13 (ref 5–15)
BUN: 62 mg/dL — ABNORMAL HIGH (ref 8–23)
CO2: 21 mmol/L — ABNORMAL LOW (ref 22–32)
Calcium: 8.4 mg/dL — ABNORMAL LOW (ref 8.9–10.3)
Chloride: 98 mmol/L (ref 98–111)
Creatinine, Ser: 2.75 mg/dL — ABNORMAL HIGH (ref 0.61–1.24)
GFR calc Af Amer: 27 mL/min — ABNORMAL LOW (ref >60)
GFR calc non Af Amer: 23 mL/min — ABNORMAL LOW (ref >60)
Glucose, Bld: 156 mg/dL — ABNORMAL HIGH (ref 70–99)
Potassium: 4 mmol/L (ref 3.5–5.1)
Sodium: 132 mmol/L — ABNORMAL LOW (ref 135–145)
Total Bilirubin: 0.5 mg/dL (ref 0.3–1.2)
Total Protein: 6.4 g/dL — ABNORMAL LOW (ref 6.5–8.1)

## 2020-05-23 LAB — CK: Total CK: 861 U/L — ABNORMAL HIGH (ref 49–397)

## 2020-05-23 LAB — BRAIN NATRIURETIC PEPTIDE: B Natriuretic Peptide: 204.6 pg/mL — ABNORMAL HIGH (ref 0.0–100.0)

## 2020-05-23 LAB — FERRITIN: Ferritin: 1040 ng/mL — ABNORMAL HIGH (ref 24–336)

## 2020-05-23 LAB — SODIUM, URINE, RANDOM: Sodium, Ur: 26 mmol/L

## 2020-05-23 LAB — OSMOLALITY, URINE: Osmolality, Ur: 403 mOsm/kg (ref 300–900)

## 2020-05-23 LAB — C-REACTIVE PROTEIN: CRP: 19.3 mg/dL — ABNORMAL HIGH (ref <1.0)

## 2020-05-23 LAB — OSMOLALITY: Osmolality: 304 mOsm/kg — ABNORMAL HIGH (ref 275–295)

## 2020-05-23 LAB — PHOSPHORUS: Phosphorus: 5.1 mg/dL — ABNORMAL HIGH (ref 2.5–4.6)

## 2020-05-23 LAB — PROCALCITONIN: Procalcitonin: 1.18 ng/mL

## 2020-05-23 LAB — MAGNESIUM: Magnesium: 2.4 mg/dL (ref 1.7–2.4)

## 2020-05-23 LAB — URIC ACID: Uric Acid, Serum: 9.7 mg/dL — ABNORMAL HIGH (ref 3.7–8.6)

## 2020-05-23 MED ORDER — CHLORHEXIDINE GLUCONATE CLOTH 2 % EX PADS
6.0000 | MEDICATED_PAD | Freq: Every day | CUTANEOUS | Status: DC
  Administered 2020-05-23 – 2020-05-25 (×3): 6 via TOPICAL

## 2020-05-23 MED ORDER — LACTATED RINGERS IV SOLN: INTRAVENOUS | Status: AC

## 2020-05-23 MED ORDER — FAMOTIDINE 20 MG PO TABS
20.0000 mg | ORAL_TABLET | Freq: Every day | ORAL | Status: DC
  Administered 2020-05-24 – 2020-05-26 (×3): 20 mg via ORAL
  Filled 2020-05-23 (×3): qty 1

## 2020-05-23 MED ORDER — METHYLPREDNISOLONE SODIUM SUCC 125 MG IJ SOLR
60.0000 mg | Freq: Two times a day (BID) | INTRAMUSCULAR | Status: DC
  Administered 2020-05-23 (×2): 60 mg via INTRAVENOUS
  Filled 2020-05-23 (×2): qty 2

## 2020-05-23 NOTE — Progress Notes (Signed)
Lower extremity venous bilateral study completed.   Please see CV Proc for preliminary results.   [name]  

## 2020-05-23 NOTE — Progress Notes (Signed)
PROGRESS NOTE                                                                                                                                                                                                             Patient Demographics:    [name], is a 64 y.o. male, DOB - [birth year], JXB:[id number]  Outpatient Primary MD for the patient is Patient, No Pcp Per    LOS - 1  Admit date - 05/21/2020    Chief Complaint  Patient presents with  . Cough    COVID+       Brief Narrative - [name] is a 64 y.o. male with  past medical history significant for alcohol and vape, who is not vaccinated for COVID-19, use presented with complaints of worsening cough and weakness over the last 5-6 weeks, the ER work-up suggestive of acute hypoxic respiratory failure due to COVID-19 pneumonia, elevated D-dimer, renal failure, left renal mass with hydronephrosis, urology was consulted and he was admitted to the hospital.   Subjective:    [name] today has, No headache, No chest pain, No abdominal pain - No Nausea, No new weakness tingling or numbness, Mild SOB.   Assessment  & Plan :     1. Acute Hypoxic Resp. Failure due to Acute Covid 19 Viral Pneumonitis during the ongoing 2020 Covid 19 Pandemic - he has moderate disease, he has been started on IV steroids along with remdesivir, will continue to monitor, he has consented for Baricitinib use in case his inflammation and hypoxia get worse.  He would be closely monitored.  If any further clinical decline Baricitinib will be added.  Encouraged the patient to sit up in chair in the daytime use I-S and flutter valve for pulmonary toiletry and then prone in bed when at night.  Will advance activity and titrate down oxygen as possible.  Actemra/Baricitinib  off label use - patient was told that if COVID-19 pneumonitis gets worse we might potentially use Actemra off label,  patient denies any known history of active diverticulitis, tuberculosis or hepatitis, understands the risks and benefits and wants to proceed with Actemra treatment if required.     SpO2: 90 % O2 Flow Rate (L/min): 4 L/min  Recent Labs  Lab 05/21/20 1915 05/21/20 1929 05/22/20 0850 05/23/20 0513  WBC  --  6.7  --  5.9  PLT  --  358  --  365  CRP  --   --  22.5* 19.3*  BNP  --   --  62.9 204.6*  DDIMER  --   --  4.31*  --   PROCALCITON  --   --  1.38  --   AST  --  71*  --  60*  ALT  --  44  --  49*  ALKPHOS  --  36*  --  34*  BILITOT  --  0.9  --  0.5  ALBUMIN  --  2.4*  --  2.1*  LATICACIDVEN  --   --  1.7  --   SARSCOV2NAA POSITIVE*  --   --   --        2.  Left renal mass with hydronephrosis and renal failure.  Urology has been consulted, continue Foley catheter, will be discharged on Foley with outpatient urology follow-up.  3.  Renal failure.  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  Will be discharged on Foley with outpatient urology and most likely nephrology follow-up as well.  4.  Elevated D-dimer.  Likely due to inflammation, VQ negative, leg ultrasound pending.  Continue heparin for now.  5.  Elevated BNP, clinically appears dehydrated although he does have some rails.  Will check echocardiogram as well.  6.  History of vaping.  2-3 bottles of beer every day, has not had any beer in 2 weeks according to him, no signs of DTs, monitor closely.  Counseled to quit all.    Condition - Extremely Guarded  Family Communication  : Wife Eustaquio Maize [phone] on 05/23/2020 message left at 10:33 AM.  Code Status :  Full  Consults  :  Urology  Procedures  :    TTE  Leg US  Renal US - 1. Large solid mass this acted in the left kidney measuring greater than 10 cm, not well assessed by ultrasound. Recommend further evaluation with renal  protocol CT or MRI. If patient is not a candidate for IV contrast given acute renal injury, MRI is recommended. This should only be obtained if patient is able to tolerate breath hold technique. 2. Mild right hydronephrosis and mild to moderate left hydronephrosis. Heterogeneous fluid in the left renal collecting system. Query urinary tract infection. 3. Increased bilateral renal parenchymal echogenicity suggesting chronic medical renal disease. 4. Debris in the urinary bladder. No bladder wall thickening  VQ - No PE    PUD Prophylaxis : None  Disposition Plan  :    Status is: Inpatient  Remains inpatient appropriate because:IV treatments appropriate due to intensity of illness or inability to take PO   Dispo: The patient is from: Home              Anticipated d/c is to: Home              Anticipated d/c date is: > 3 days              Patient currently is not medically stable to d/c.   DVT Prophylaxis  :   Heparin   Lab Results  Component Value Date   PLT 365 05/23/2020    Diet :  Diet Order            Diet regular Room service appropriate? Yes; Fluid consistency: Thin  Diet effective  now                  Inpatient Medications  Scheduled Meds: . albuterol  2 puff Inhalation Q6H  . vitamin C  500 mg Oral Daily  . azithromycin  500 mg Oral Daily  . Chlorhexidine Gluconate Cloth  6 each Topical Daily  . famotidine  20 mg Oral BID  . feeding supplement (ENSURE ENLIVE)  237 mL Oral BID BM  . heparin  5,000 Units Subcutaneous Q8H  . methylPREDNISolone (SOLU-MEDROL) injection  60 mg Intravenous Q12H  . sodium chloride flush  3 mL Intravenous Q12H  . zinc sulfate  220 mg Oral Daily   Continuous Infusions: . cefTRIAXone (ROCEPHIN)  IV Stopped (05/22/20 1218)  . lactated ringers 125 mL/hr at 05/23/20 0754  . remdesivir 100 mg in NS 100 mL 100 mg (05/23/20 0853)   PRN Meds:.acetaminophen, chlorpheniramine-HYDROcodone, guaiFENesin-dextromethorphan, [DISCONTINUED]  ondansetron **OR** ondansetron (ZOFRAN) IV  Antibiotics  :    Anti-infectives (From admission, onward)   Start     Dose/Rate Route Frequency Ordered Stop   05/23/20 1000  remdesivir 100 mg in sodium chloride 0.9 % 100 mL IVPB       "Followed by" Linked Group Details   100 mg 200 mL/hr over 30 Minutes Intravenous Daily 05/22/20 0807 05/27/20 0959   05/22/20 1115  cefTRIAXone (ROCEPHIN) 2 g in sodium chloride 0.9 % 100 mL IVPB        2 g 200 mL/hr over 30 Minutes Intravenous Every 24 hours 05/22/20 1107 05/27/20 1114   05/22/20 1115  azithromycin (ZITHROMAX) tablet 500 mg        500 mg Oral Daily 05/22/20 1107 05/27/20 0959   05/22/20 1000  remdesivir 200 mg in sodium chloride 0.9% 250 mL IVPB       "Followed by" Linked Group Details   200 mg 580 mL/hr over 30 Minutes Intravenous Once 05/22/20 0807 05/22/20 1101       Time Spent in minutes  30   Lala Lund M.D on 05/23/2020 at 10:26 AM  To page go to [email or web address] - password [facility]  [facility] -  Office  [phone]   See all Orders from today for further details    Objective:   Vitals:   05/22/20 2233 05/22/20 2234 05/22/20 2340 05/23/20 0316  BP: 97/68  108/62 102/60  Pulse: 62 95 81 82  Resp: 15 (!) 22 20 20   Temp:   98.1 F (36.7 C) 98.2 F (36.8 C)  TempSrc:   Oral Axillary  SpO2:  91% 90% 90%  Weight:   66.4 kg   Height:   5\' 9"  (1.753 m)     Wt Readings from Last 3 Encounters:  05/22/20 66.4 kg     Intake/Output Summary (Last 24 hours) at 05/23/2020 1026 Last data filed at 05/23/2020 2992 Gross per 24 hour  Intake 509.68 ml  Output 750 ml  Net -240.32 ml     Physical Exam  Awake Alert, No new F.N deficits, Normal affect .AT,PERRAL Supple Neck,No JVD, No cervical lymphadenopathy appriciated.  Symmetrical Chest wall movement, Good air movement bilaterally, few rales RRR,No Gallops,Rubs or new Murmurs, No Parasternal Heave +ve B.Sounds, Abd Soft, No tenderness, No organomegaly  appriciated, No rebound - guarding or rigidity. No Cyanosis, Clubbing or edema, No new Rash or bruise       Data Review:    CBC Recent Labs  Lab 05/21/20 1929 05/23/20 0513  WBC 6.7 5.9  HGB 11.0* 11.1*  HCT 33.9* 34.0*  PLT 358 365  MCV 78.8* 78.7*  MCH 25.6* 25.7*  MCHC 32.4 32.6  RDW 15.4 15.3  LYMPHSABS 0.1* 0.3*  MONOABS 0.1 0.1  EOSABS 0.0 0.0  BASOSABS 0.0 0.0    Recent Labs  Lab 05/21/20 1929 05/22/20 0850 05/23/20 0513  NA 129*  --  132*  K 4.4  --  4.0  CL 94*  --  98  CO2 22  --  21*  GLUCOSE 111*  --  156*  BUN 47*  --  62*  CREATININE 2.63*  --  2.75*  CALCIUM 8.7*  --  8.4*  AST 71*  --  60*  ALT 44  --  49*  ALKPHOS 36*  --  34*  BILITOT 0.9  --  0.5  ALBUMIN 2.4*  --  2.1*  MG  --   --  2.4  CRP  --  22.5* 19.3*  DDIMER  --  4.31*  --   PROCALCITON  --  1.38  --   LATICACIDVEN  --  1.7  --   BNP  --  62.9 204.6*    ------------------------------------------------------------------------------------------------------------------ No results for input(s): CHOL, HDL, LDLCALC, TRIG, CHOLHDL, LDLDIRECT in the last 72 hours.  No results found for: HGBA1C ------------------------------------------------------------------------------------------------------------------ No results for input(s): TSH, T4TOTAL, T3FREE, THYROIDAB in the last 72 hours.  Invalid input(s): FREET3  Cardiac Enzymes No results for input(s): CKMB, TROPONINI, MYOGLOBIN in the last 168 hours.  Invalid input(s): CK ------------------------------------------------------------------------------------------------------------------    Component Value Date/Time   BNP 204.6 (H) 05/23/2020 6945    Micro Results Recent Results (from the past 240 hour(s))  SARS Coronavirus 2 by RT PCR (hospital order, performed in [facility] hospital lab) Nasopharyngeal Nasopharyngeal Swab     Status: Abnormal   Collection Time: 05/21/20  7:15 PM   Specimen: Nasopharyngeal Swab  Result  Value Ref Range Status   SARS Coronavirus 2 POSITIVE (A) NEGATIVE Final    Comment: RESULT CALLED TO, READ BACK BY AND VERIFIED WITH: K,MUNNETT RN @2109  05/21/20 EB (NOTE) SARS-CoV-2 target nucleic acids are DETECTED  SARS-CoV-2 RNA is generally detectable in upper respiratory specimens  during the acute phase of infection.  Positive results are indicative  of the presence of the identified virus, but do not rule out bacterial infection or co-infection with other pathogens not detected by the test.  Clinical correlation with patient history and  other diagnostic information is necessary to determine patient infection status.  The expected result is negative.  Fact Sheet for Patients:   StrictlyIdeas.no   Fact Sheet for Healthcare Providers:   BankingDealers.co.za    This test is not yet approved or cleared by the [place] FDA and  has been authorized for detection and/or diagnosis of SARS-CoV-2 by FDA under an Emergency Use Authorization (EUA).  This EUA will remain in effect (meaning this test can  be used) for the duration of  the COVID-19 declaration under Section 564(b)(1) of the Act, 21 U.S.C. section 360-bbb-3(b)(1), unless the authorization is terminated or revoked sooner.  Performed at [facility], [address] [place], [place] [address]     Radiology Reports NM Pulmonary Perfusion  Result Date: 05/22/2020 CLINICAL DATA:  PE suspected, low to intermediate probability, positive D-dimer EXAM: NUCLEAR MEDICINE PERFUSION LUNG SCAN TECHNIQUE: Perfusion images were obtained in multiple projections after intravenous injection of radiopharmaceutical. Ventilation scans intentionally deferred if perfusion scan and chest x-ray adequate for interpretation during COVID 19 epidemic. RADIOPHARMACEUTICALS:  4.4  mCi Tc-77m MAA IV COMPARISON:  Radiograph 05/21/2020 FINDINGS: There are several nonsegmental perfusion defects  predominantly within the right and left mid lungs corresponding well to areas of opacity seen on comparison chest radiograph. No large segmental or isolated perfusion defects are identified. IMPRESSION: Per the modified perfusion-only PIOPED II criteria, pulmonary embolism is absent (normal or very low probability). Electronically Signed   By: Lovena Le M.D.   On: 05/22/2020 18:35   US RENAL  Result Date: 05/22/2020 CLINICAL DATA:  Acute kidney injury EXAM: RENAL / URINARY TRACT ULTRASOUND COMPLETE COMPARISON:  None. FINDINGS: Right Kidney: Renal measurements: 11.7 x 5.9 x 6.3 cm = volume: 229 mL. Minimal right hydronephrosis and prominence of the proximal ureter. There is increased renal parenchymal echogenicity. Small amount of non organized perinephric fluid. No shadowing calculus or focal lesion. Left Kidney: Renal measurements: 15.4 x 9.8 x 12.2 cm = volume: 965 mL. Lobulated renal contours. Heterogeneous solid mass in the left kidney measures 11.2 x 9.4 x 12 cm. Mild to moderate left hydronephrosis and proximal hydroureter. Heterogeneous fluid in the renal collecting system. There is increased renal parenchymal echogenicity. Bladder: Debris in the urinary bladder.  There is no bladder wall thickening. Other: None. IMPRESSION: 1. Large solid mass this acted in the left kidney measuring greater than 10 cm, not well assessed by ultrasound. Recommend further evaluation with renal protocol CT or MRI. If patient is not a candidate for IV contrast given acute renal injury, MRI is recommended. This should only be obtained if patient is able to tolerate breath hold technique. 2. Mild right hydronephrosis and mild to moderate left hydronephrosis. Heterogeneous fluid in the left renal collecting system. Query urinary tract infection. 3. Increased bilateral renal parenchymal echogenicity suggesting chronic medical renal disease. 4. Debris in the urinary bladder. No bladder wall thickening. Electronically Signed    By: Keith Rake M.D.   On: 05/22/2020 17:21   DG Chest Port 1 View  Result Date: 05/23/2020 CLINICAL DATA:  COVID. EXAM: PORTABLE CHEST 1 VIEW COMPARISON:  Two days ago FINDINGS: Bilateral interstitial and airspace density which is stable to mildly progressed from before. No air leak or effusion seen. Normal heart size and mediastinal contours. IMPRESSION: Stable or mildly progressed bilateral pneumonia. Electronically Signed   By: Monte Fantasia M.D.   On: 05/23/2020 09:05   DG Chest Portable 1 View  Result Date: 05/21/2020 CLINICAL DATA:  Cough and throat pain. COVID positive. EXAM: PORTABLE CHEST 1 VIEW COMPARISON:  None. FINDINGS: Very mild hazy areas of early infiltrate and/or atelectasis are seen along the periphery of the mid and lower left lung there is no evidence of a pleural effusion or pneumothorax. The heart size and mediastinal contours are within normal limits. The visualized skeletal structures are unremarkable. IMPRESSION: Very mild mid and lower left lung infiltrate and/or atelectasis. Electronically Signed   By: Virgina Norfolk M.D.   On: 05/21/2020 23:06

## 2020-05-23 NOTE — Progress Notes (Signed)
Patient received to the unit. Patient is alert and oriented x3. Vital signs are stable. Iv in place. Skin assessment done with another nurse. No skin breakdown noted on examination. Given instructions about call bell and phone. Bed in low position and call bell in reach.

## 2020-05-23 NOTE — Progress Notes (Signed)
Bed alarm was placed earlier in the shift by this RN. Pt does not call for assistance from staff, rather he gets up unattended. Unsteady on feet. Pt is moderate fall risk.  Bed alarm alarming at this time, this RN into pt's room to assist. Pt states "he is no damn child and shouldn't be monitored as such." Foul language being used towards staff. Patient request bed alarm to be taken off, adamantly refusing. Bed alarm turned off due to patients request, will inform charge RN. Educated pt on safety and falls, pt states understanding.

## 2020-05-24 ENCOUNTER — Inpatient Hospital Stay (HOSPITAL_COMMUNITY): Payer: HRSA Program

## 2020-05-24 DIAGNOSIS — I5031 Acute diastolic (congestive) heart failure: Secondary | ICD-10-CM

## 2020-05-24 DIAGNOSIS — U071 COVID-19: Secondary | ICD-10-CM

## 2020-05-24 LAB — URINE CULTURE: Culture: NO GROWTH

## 2020-05-24 LAB — COMPREHENSIVE METABOLIC PANEL
ALT: 56 U/L — ABNORMAL HIGH (ref 0–44)
AST: 57 U/L — ABNORMAL HIGH (ref 15–41)
Albumin: 2.2 g/dL — ABNORMAL LOW (ref 3.5–5.0)
Alkaline Phosphatase: 35 U/L — ABNORMAL LOW (ref 38–126)
Anion gap: 10 (ref 5–15)
BUN: 58 mg/dL — ABNORMAL HIGH (ref 8–23)
CO2: 24 mmol/L (ref 22–32)
Calcium: 8.8 mg/dL — ABNORMAL LOW (ref 8.9–10.3)
Chloride: 104 mmol/L (ref 98–111)
Creatinine, Ser: 2.22 mg/dL — ABNORMAL HIGH (ref 0.61–1.24)
GFR calc Af Amer: 35 mL/min — ABNORMAL LOW (ref >60)
GFR calc non Af Amer: 30 mL/min — ABNORMAL LOW (ref >60)
Glucose, Bld: 169 mg/dL — ABNORMAL HIGH (ref 70–99)
Potassium: 4 mmol/L (ref 3.5–5.1)
Sodium: 138 mmol/L (ref 135–145)
Total Bilirubin: 0.5 mg/dL (ref 0.3–1.2)
Total Protein: 6.4 g/dL — ABNORMAL LOW (ref 6.5–8.1)

## 2020-05-24 LAB — FERRITIN: Ferritin: 1123 ng/mL — ABNORMAL HIGH (ref 24–336)

## 2020-05-24 LAB — CBC WITH DIFFERENTIAL/PLATELET
Abs Immature Granulocytes: 0 10*3/uL (ref 0.00–0.07)
Basophils Absolute: 0 10*3/uL (ref 0.0–0.1)
Basophils Relative: 0 %
Eosinophils Absolute: 0 10*3/uL (ref 0.0–0.5)
Eosinophils Relative: 0 %
HCT: 33.9 % — ABNORMAL LOW (ref 39.0–52.0)
Hemoglobin: 10.8 g/dL — ABNORMAL LOW (ref 13.0–17.0)
Lymphocytes Relative: 2 %
Lymphs Abs: 0.1 10*3/uL — ABNORMAL LOW (ref 0.7–4.0)
MCH: 24.8 pg — ABNORMAL LOW (ref 26.0–34.0)
MCHC: 31.9 g/dL (ref 30.0–36.0)
MCV: 77.9 fL — ABNORMAL LOW (ref 80.0–100.0)
Monocytes Absolute: 0.1 10*3/uL (ref 0.1–1.0)
Monocytes Relative: 2 %
Neutro Abs: 6 10*3/uL (ref 1.7–7.7)
Neutrophils Relative %: 96 %
Platelets: 434 10*3/uL — ABNORMAL HIGH (ref 150–400)
RBC: 4.35 MIL/uL (ref 4.22–5.81)
RDW: 15.3 % (ref 11.5–15.5)
WBC: 6.2 10*3/uL (ref 4.0–10.5)
nRBC: 0 % (ref 0.0–0.2)
nRBC: 0 /100 WBC

## 2020-05-24 LAB — C-REACTIVE PROTEIN: CRP: 13.7 mg/dL — ABNORMAL HIGH (ref <1.0)

## 2020-05-24 LAB — ECHOCARDIOGRAM LIMITED
Area-P 1/2: 3.37 cm2
Height: 69 in
S' Lateral: 3.2 cm
Weight: 2342.17 oz

## 2020-05-24 LAB — BRAIN NATRIURETIC PEPTIDE: B Natriuretic Peptide: 147.8 pg/mL — ABNORMAL HIGH (ref 0.0–100.0)

## 2020-05-24 LAB — LEGIONELLA PNEUMOPHILA SEROGP 1 UR AG: L. pneumophila Serogp 1 Ur Ag: NEGATIVE

## 2020-05-24 LAB — MAGNESIUM: Magnesium: 2.6 mg/dL — ABNORMAL HIGH (ref 1.7–2.4)

## 2020-05-24 LAB — PROCALCITONIN: Procalcitonin: 0.5 ng/mL

## 2020-05-24 LAB — PHOSPHORUS: Phosphorus: 3.7 mg/dL (ref 2.5–4.6)

## 2020-05-24 MED ORDER — METHYLPREDNISOLONE SODIUM SUCC 40 MG IJ SOLR
40.0000 mg | Freq: Two times a day (BID) | INTRAMUSCULAR | Status: DC
  Administered 2020-05-24 (×2): 40 mg via INTRAVENOUS
  Filled 2020-05-24 (×2): qty 1

## 2020-05-24 MED ORDER — LACTATED RINGERS IV SOLN: INTRAVENOUS | Status: AC

## 2020-05-24 NOTE — Progress Notes (Addendum)
Patient continues to get up without assistance. Noncomplaint with staff. Catheter pulled taut when pt is OOB. Bed alarm and chair alarm have been placed for safety. Will continue to monitor.   0010 Into room to find patient touching buttons on ECG wall monitor. O2 device/sensor removed from finger. Pt is not complaint with lines and devices in room. Attempted to reorient and educate patient with no success, patient continues to talk over staff. Pt continues to have catheter pulled taut. Bed alarm on for safety.

## 2020-05-24 NOTE — Progress Notes (Signed)
  Echocardiogram 2D Echocardiogram has been performed.  [name] 05/24/2020, 1:00 PM

## 2020-05-24 NOTE — TOC Initial Note (Signed)
Transition of Care [facility]) - Initial/Assessment Note    Patient Details  Name: [name] MRN: [id number] Date of Birth: 08/23/1956  [facility] of [facility] CM/SW Contact:    Carles Collet, RN Phone Number: 05/24/2020, 1:58 PM  Clinical Narrative:     [name] w patient over the phone. He states he will [address] [place] [address] with his wife who will provide transport home. He denies insurance coverage, despite being age eligible for Medicare.  He has co-op from [facility] that he could not name nor describe the benefits it provided.  He states that he pay cash or credit card for healthcare expenses. CM will continue to follow for potential home O2 and medication needs.              Expected Discharge Plan: Home/Self Care Barriers to Discharge: Continued Medical Work up   Patient Goals and CMS Choice        Expected Discharge Plan and Services Expected Discharge Plan: Home/Self Care                                              Prior Living Arrangements/Services                       Activities of Daily Living Home Assistive Devices/Equipment: None ADL Screening (condition at time of admission) Patient's cognitive ability adequate to safely complete daily activities?: Yes Is the patient deaf or have difficulty hearing?: No Does the patient have difficulty seeing, even when wearing glasses/contacts?: No Does the patient have difficulty concentrating, remembering, or making decisions?: No Patient able to express need for assistance with ADLs?: Yes Does the patient have difficulty dressing or bathing?: No Independently performs ADLs?: Yes (appropriate for developmental age) Does the patient have difficulty walking or climbing stairs?: No Weakness of Legs: Both Weakness of Arms/Hands: None  Permission Sought/Granted                  Emotional Assessment              Admission diagnosis:  Hypoxia [R09.02] AKI (acute kidney injury)  ([place]) [N17.9] Acute renal failure, unspecified acute renal failure type ([place]) [N17.9] Pneumonia due to COVID-19 virus [U07.1, J12.82] COVID-19 [U07.1] Patient Active Problem List   Diagnosis Date Noted  . Pneumonia due to COVID-19 virus 05/22/2020  . Elevated troponin 05/22/2020  . Hypoalbuminemia 05/22/2020  . Microcytic hypochromic anemia 05/22/2020  . Acute respiratory failure with hypoxia ([place]) 05/22/2020  . Hyponatremia 05/22/2020  . Hypochloremia 05/22/2020  . Elevated AST (SGOT) 05/22/2020   PCP:  Patient, No Pcp Per Pharmacy:   [facility] [place], [address] [place] [address] Phone: [phone] Fax: [phone]     Social Determinants of Health (SDOH) Interventions    Readmission Risk Interventions No flowsheet data found.

## 2020-05-25 DIAGNOSIS — U071 COVID-19: Secondary | ICD-10-CM | POA: Diagnosis not present

## 2020-05-25 DIAGNOSIS — J1282 Pneumonia due to coronavirus disease 2019: Secondary | ICD-10-CM | POA: Diagnosis not present

## 2020-05-25 LAB — CBC WITH DIFFERENTIAL/PLATELET
Abs Immature Granulocytes: 0.02 10*3/uL (ref 0.00–0.07)
Basophils Absolute: 0 10*3/uL (ref 0.0–0.1)
Basophils Relative: 0 %
Eosinophils Absolute: 0 10*3/uL (ref 0.0–0.5)
Eosinophils Relative: 0 %
HCT: 31.3 % — ABNORMAL LOW (ref 39.0–52.0)
Hemoglobin: 10.1 g/dL — ABNORMAL LOW (ref 13.0–17.0)
Immature Granulocytes: 0 %
Lymphocytes Relative: 5 %
Lymphs Abs: 0.2 10*3/uL — ABNORMAL LOW (ref 0.7–4.0)
MCH: 25.4 pg — ABNORMAL LOW (ref 26.0–34.0)
MCHC: 32.3 g/dL (ref 30.0–36.0)
MCV: 78.6 fL — ABNORMAL LOW (ref 80.0–100.0)
Monocytes Absolute: 0.1 10*3/uL (ref 0.1–1.0)
Monocytes Relative: 2 %
Neutro Abs: 4.4 10*3/uL (ref 1.7–7.7)
Neutrophils Relative %: 93 %
Platelets: 398 10*3/uL (ref 150–400)
RBC: 3.98 MIL/uL — ABNORMAL LOW (ref 4.22–5.81)
RDW: 15.4 % (ref 11.5–15.5)
WBC: 4.7 10*3/uL (ref 4.0–10.5)
nRBC: 0 % (ref 0.0–0.2)

## 2020-05-25 LAB — COMPREHENSIVE METABOLIC PANEL
ALT: 54 U/L — ABNORMAL HIGH (ref 0–44)
AST: 46 U/L — ABNORMAL HIGH (ref 15–41)
Albumin: 1.9 g/dL — ABNORMAL LOW (ref 3.5–5.0)
Alkaline Phosphatase: 36 U/L — ABNORMAL LOW (ref 38–126)
Anion gap: 12 (ref 5–15)
BUN: 53 mg/dL — ABNORMAL HIGH (ref 8–23)
CO2: 22 mmol/L (ref 22–32)
Calcium: 8.4 mg/dL — ABNORMAL LOW (ref 8.9–10.3)
Chloride: 104 mmol/L (ref 98–111)
Creatinine, Ser: 2.26 mg/dL — ABNORMAL HIGH (ref 0.61–1.24)
GFR calc Af Amer: 34 mL/min — ABNORMAL LOW (ref >60)
GFR calc non Af Amer: 29 mL/min — ABNORMAL LOW (ref >60)
Glucose, Bld: 162 mg/dL — ABNORMAL HIGH (ref 70–99)
Potassium: 3.7 mmol/L (ref 3.5–5.1)
Sodium: 138 mmol/L (ref 135–145)
Total Bilirubin: 0.4 mg/dL (ref 0.3–1.2)
Total Protein: 5.4 g/dL — ABNORMAL LOW (ref 6.5–8.1)

## 2020-05-25 LAB — TSH: TSH: 0.379 u[IU]/mL (ref 0.350–4.500)

## 2020-05-25 LAB — C-REACTIVE PROTEIN: CRP: 7 mg/dL — ABNORMAL HIGH (ref <1.0)

## 2020-05-25 LAB — VITAMIN B12: Vitamin B-12: 1293 pg/mL — ABNORMAL HIGH (ref 180–914)

## 2020-05-25 LAB — MAGNESIUM: Magnesium: 2.3 mg/dL (ref 1.7–2.4)

## 2020-05-25 LAB — FERRITIN: Ferritin: 681 ng/mL — ABNORMAL HIGH (ref 24–336)

## 2020-05-25 LAB — BRAIN NATRIURETIC PEPTIDE: B Natriuretic Peptide: 190.2 pg/mL — ABNORMAL HIGH (ref 0.0–100.0)

## 2020-05-25 LAB — PHOSPHORUS: Phosphorus: 3.5 mg/dL (ref 2.5–4.6)

## 2020-05-25 LAB — PROCALCITONIN: Procalcitonin: 0.24 ng/mL

## 2020-05-25 MED ORDER — METHYLPREDNISOLONE SODIUM SUCC 40 MG IJ SOLR
40.0000 mg | Freq: Every day | INTRAMUSCULAR | Status: DC
  Administered 2020-05-25: 40 mg via INTRAVENOUS
  Filled 2020-05-25: qty 1

## 2020-05-25 NOTE — Progress Notes (Signed)
PROGRESS NOTE                                                                                                                                                                                                             Patient Demographics:    [name], is a 64 y.o. male, DOB - 1956-06-02, BZJ:[id number]  Outpatient Primary MD for the patient is Patient, No Pcp Per    LOS - 3  Admit date - 05/21/2020    Chief Complaint  Patient presents with  . Cough    COVID+       Brief Narrative - [name] is a 64 y.o. male with  past medical history significant for alcohol and vape, who is not vaccinated for COVID-19, use presented with complaints of worsening cough and weakness over the last 5-6 weeks, the ER work-up suggestive of acute hypoxic respiratory failure due to COVID-19 pneumonia, elevated D-dimer, renal failure, left renal mass with hydronephrosis, urology was consulted and he was admitted to the hospital.   Subjective:    Patient in bed, appears comfortable, denies any headache, no fever, no chest pain or pressure, no shortness of breath , no abdominal pain. No focal weakness.   Assessment  & Plan :     1. Acute Hypoxic Resp. Failure due to Acute Covid 19 Viral Pneumonitis during the ongoing 2020 Covid 19 Pandemic - he has moderate disease, he has been started on IV steroids along with remdesivir, will continue to monitor, he has consented for Baricitinib use in case his inflammation and hypoxia get worse.  He would be closely monitored.     Encouraged the patient to sit up in chair in the daytime use I-S and flutter valve for pulmonary toiletry and then prone in bed when at night.  Will advance activity and titrate down oxygen as possible.    SpO2: (!) 89 % O2 Flow Rate (L/min): 2 L/min  Recent Labs  Lab 05/21/20 1915 05/21/20 1929 05/22/20 0850 05/23/20 0513 05/24/20 0236 05/24/20 0237 05/25/20 0252  05/25/20 0253  WBC  --  6.7  --  5.9 6.2  --  4.7  --   PLT  --  358  --  365 434*  --  398  --   CRP  --   --  22.5* 19.3* 13.7*  --  7.0*  --   BNP  --   --  62.9 204.6*  --  147.8*  --  190.2*  DDIMER  --   --  4.31*  --   --   --   --   --   PROCALCITON  --   --  1.38 1.18 0.50  --  0.24  --   AST  --  71*  --  60* 57*  --  46*  --   ALT  --  44  --  49* 56*  --  54*  --   ALKPHOS  --  36*  --  34* 35*  --  36*  --   BILITOT  --  0.9  --  0.5 0.5  --  0.4  --   ALBUMIN  --  2.4*  --  2.1* 2.2*  --  1.9*  --   LATICACIDVEN  --   --  1.7  --   --   --   --   --   SARSCOV2NAA POSITIVE*  --   --   --   --   --   --   --        2.  Left renal mass with hydronephrosis and renal failure.  Urology has been consulted, continue Foley catheter, will be discharged on Foley with outpatient urology follow-up.  3.  Renal failure.  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  Will be discharged on Foley with outpatient urology and most likely nephrology follow-up as well.  4.  Elevated D-dimer.  Likely due to inflammation, VQ negative, leg ultrasound pending.  Continue heparin for now.  5.  Elevated BNP, clinically appears dehydrated although he does have some rails.  Will check echocardiogram as well.  6.  History of vaping.  2-3 bottles of beer every day, has not had any beer in 2 weeks according to him, no signs of DTs, monitor closely.  Counseled to quit all.  Checking B12 and TSH.    Condition - Extremely Guarded  Family Communication  :   Son [phone] on 05/25/20  Wife [name] [phone] on 05/23/2020 message left at 10:33 AM.  Code Status :  Full  Consults  :  Urology  Procedures  :    TTE - 1. Left ventricular ejection fraction, by estimation, is 60 to 65%. The left ventricle has normal function. The left ventricle has no regional wall motion  abnormalities. Left ventricular diastolic parameters were normal.  2. Right ventricular systolic function is normal. The right ventricular size is normal. There is normal pulmonary artery systolic pressure.  3. The mitral valve is normal in structure. No evidence of mitral valve regurgitation. No evidence of mitral stenosis.  4. The aortic valve is tricuspid. Aortic valve regurgitation is not visualized. No aortic stenosis is present.  5. The inferior vena cava is normal in size with greater than 50% respiratory variability, suggesting right atrial pressure of 3 mmHg.  Leg US  Renal US - 1. Large solid mass this acted in the left kidney measuring greater than 10 cm, not well assessed by ultrasound. Recommend further evaluation with renal protocol CT or MRI. If patient is not a candidate for IV contrast given acute renal injury, MRI is recommended. This should only be obtained if patient is able to tolerate breath hold technique. 2. Mild right  hydronephrosis and mild to moderate left hydronephrosis. Heterogeneous fluid in the left renal collecting system. Query urinary tract infection. 3. Increased bilateral renal parenchymal echogenicity suggesting chronic medical renal disease. 4. Debris in the urinary bladder. No bladder wall thickening  VQ - No PE    PUD Prophylaxis : None  Disposition Plan  :    Status is: Inpatient  Remains inpatient appropriate because:IV treatments appropriate due to intensity of illness or inability to take PO   Dispo: The patient is from: Home              Anticipated d/c is to: Home              Anticipated d/c date is: > 3 days              Patient currently is not medically stable to d/c.   DVT Prophylaxis  :   Heparin   Lab Results  Component Value Date   PLT 398 05/25/2020    Diet :  Diet Order            Diet regular Room service appropriate? Yes; Fluid consistency: Thin  Diet effective now                  Inpatient  Medications  Scheduled Meds: . albuterol  2 puff Inhalation Q6H  . vitamin C  500 mg Oral Daily  . Chlorhexidine Gluconate Cloth  6 each Topical Daily  . famotidine  20 mg Oral Daily  . feeding supplement (ENSURE ENLIVE)  237 mL Oral BID BM  . heparin  5,000 Units Subcutaneous Q8H  . methylPREDNISolone (SOLU-MEDROL) injection  40 mg Intravenous Daily  . sodium chloride flush  3 mL Intravenous Q12H  . zinc sulfate  220 mg Oral Daily   Continuous Infusions: . remdesivir 100 mg in NS 100 mL 100 mg (05/25/20 0913)   PRN Meds:.acetaminophen, chlorpheniramine-HYDROcodone, guaiFENesin-dextromethorphan, [DISCONTINUED] ondansetron **OR** ondansetron (ZOFRAN) IV  Antibiotics  :    Anti-infectives (From admission, onward)   Start     Dose/Rate Route Frequency Ordered Stop   05/23/20 1000  remdesivir 100 mg in sodium chloride 0.9 % 100 mL IVPB       "Followed by" Linked Group Details   100 mg 200 mL/hr over 30 Minutes Intravenous Daily 05/22/20 0807 05/27/20 0959   05/22/20 1115  cefTRIAXone (ROCEPHIN) 2 g in sodium chloride 0.9 % 100 mL IVPB  Status:  Discontinued        2 g 200 mL/hr over 30 Minutes Intravenous Every 24 hours 05/22/20 1107 05/25/20 0727   05/22/20 1115  azithromycin (ZITHROMAX) tablet 500 mg  Status:  Discontinued        500 mg Oral Daily 05/22/20 1107 05/25/20 0727   05/22/20 1000  remdesivir 200 mg in sodium chloride 0.9% 250 mL IVPB       "Followed by" Linked Group Details   200 mg 580 mL/hr over 30 Minutes Intravenous Once 05/22/20 0807 05/22/20 1101       Time Spent in minutes  30   Lala Lund M.D on 05/25/2020 at 10:02 AM  To page go to [email or web address] - password [facility]  [facility] -  Office  [phone]   See all Orders from today for further details    Objective:   Vitals:   05/23/20 2031 05/24/20 1435 05/24/20 2020 05/25/20 0722  BP: 117/74 109/67 135/76 109/66  Pulse: 84 83 84 66  Resp: 15 (!) 21 20 20   Temp:  98.1 F (36.7 C) 97.8 F  (36.6 C) 97.8 F (36.6 C) 97.7 F (36.5 C)  TempSrc: Oral Oral Oral Oral  SpO2: 94% 95% (!) 88% (!) 89%  Weight:      Height:        Wt Readings from Last 3 Encounters:  05/22/20 66.4 kg     Intake/Output Summary (Last 24 hours) at 05/25/2020 1002 Last data filed at 05/25/2020 0910 Gross per 24 hour  Intake 1834.23 ml  Output 1450 ml  Net 384.23 ml     Physical Exam  Awake Alert, No new F.N deficits, Normal affect [place].AT,PERRAL Supple Neck,No JVD, No cervical lymphadenopathy appriciated.  Symmetrical Chest wall movement, Good air movement bilaterally, CTAB RRR,No Gallops, Rubs or new Murmurs, No Parasternal Heave +ve B.Sounds, Abd Soft, No tenderness, No organomegaly appriciated, No rebound - guarding or rigidity. No Cyanosis, Clubbing or edema, No new Rash or bruise       Data Review:    CBC Recent Labs  Lab 05/21/20 1929 05/23/20 0513 05/24/20 0236 05/25/20 0252  WBC 6.7 5.9 6.2 4.7  HGB 11.0* 11.1* 10.8* 10.1*  HCT 33.9* 34.0* 33.9* 31.3*  PLT 358 365 434* 398  MCV 78.8* 78.7* 77.9* 78.6*  MCH 25.6* 25.7* 24.8* 25.4*  MCHC 32.4 32.6 31.9 32.3  RDW 15.4 15.3 15.3 15.4  LYMPHSABS 0.1* 0.3* 0.1* 0.2*  MONOABS 0.1 0.1 0.1 0.1  EOSABS 0.0 0.0 0.0 0.0  BASOSABS 0.0 0.0 0.0 0.0    Recent Labs  Lab 05/21/20 1929 05/22/20 0850 05/23/20 0513 05/24/20 0236 05/24/20 0237 05/25/20 0252 05/25/20 0253  NA 129*  --  132* 138  --  138  --   K 4.4  --  4.0 4.0  --  3.7  --   CL 94*  --  98 104  --  104  --   CO2 22  --  21* 24  --  22  --   GLUCOSE 111*  --  156* 169*  --  162*  --   BUN 47*  --  62* 58*  --  53*  --   CREATININE 2.63*  --  2.75* 2.22*  --  2.26*  --   CALCIUM 8.7*  --  8.4* 8.8*  --  8.4*  --   AST 71*  --  60* 57*  --  46*  --   ALT 44  --  49* 56*  --  54*  --   ALKPHOS 36*  --  34* 35*  --  36*  --   BILITOT 0.9  --  0.5 0.5  --  0.4  --   ALBUMIN 2.4*  --  2.1* 2.2*  --  1.9*  --   MG  --   --  2.4 2.6*  --  2.3  --   CRP  --  22.5*  19.3* 13.7*  --  7.0*  --   DDIMER  --  4.31*  --   --   --   --   --   PROCALCITON  --  1.38 1.18 0.50  --  0.24  --   LATICACIDVEN  --  1.7  --   --   --   --   --   BNP  --  62.9 204.6*  --  147.8*  --  190.2*    ------------------------------------------------------------------------------------------------------------------ No results for input(s): CHOL, HDL, LDLCALC, TRIG, CHOLHDL, LDLDIRECT in the last 72 hours.  No results found for: HGBA1C ------------------------------------------------------------------------------------------------------------------  No results for input(s): TSH, T4TOTAL, T3FREE, THYROIDAB in the last 72 hours.  Invalid input(s): FREET3  Cardiac Enzymes No results for input(s): CKMB, TROPONINI, MYOGLOBIN in the last 168 hours.  Invalid input(s): CK ------------------------------------------------------------------------------------------------------------------    Component Value Date/Time   BNP 190.2 (H) 05/25/2020 0253    Micro Results Recent Results (from the past 240 hour(s))  SARS Coronavirus 2 by RT PCR (hospital order, performed in [facility] hospital lab) Nasopharyngeal Nasopharyngeal Swab     Status: Abnormal   Collection Time: 05/21/20  7:15 PM   Specimen: Nasopharyngeal Swab  Result Value Ref Range Status   SARS Coronavirus 2 POSITIVE (A) NEGATIVE Final    Comment: RESULT CALLED TO, READ BACK BY AND VERIFIED WITH: K,MUNNETT RN @2109  05/21/20 EB (NOTE) SARS-CoV-2 target nucleic acids are DETECTED  SARS-CoV-2 RNA is generally detectable in upper respiratory specimens  during the acute phase of infection.  Positive results are indicative  of the presence of the identified virus, but do not rule out bacterial infection or co-infection with other pathogens not detected by the test.  Clinical correlation with patient history and  other diagnostic information is necessary to determine patient infection status.  The expected result is  negative.  Fact Sheet for Patients:   StrictlyIdeas.no   Fact Sheet for Healthcare Providers:   BankingDealers.co.za    This test is not yet approved or cleared by the [place] FDA and  has been authorized for detection and/or diagnosis of SARS-CoV-2 by FDA under an Emergency Use Authorization (EUA).  This EUA will remain in effect (meaning this test can  be used) for the duration of  the COVID-19 declaration under Section 564(b)(1) of the Act, 21 U.S.C. section 360-bbb-3(b)(1), unless the authorization is terminated or revoked sooner.  Performed at [facility], [place] [address], [place], [place] [address]   Culture, blood (single) w Reflex to ID Panel     Status: None (Preliminary result)   Collection Time: 05/22/20  8:50 AM   Specimen: BLOOD  Result Value Ref Range Status   Specimen Description BLOOD LEFT ANTECUBITAL  Final   Special Requests   Final    BOTTLES DRAWN AEROBIC AND ANAEROBIC Blood Culture adequate volume   Culture   Final    NO GROWTH 3 DAYS Performed at [facility], [place] [address], [place], [place] [address]    Report Status PENDING  Incomplete  Culture, Urine     Status: None   Collection Time: 05/22/20  7:56 PM   Specimen: Urine, Catheterized  Result Value Ref Range Status   Specimen Description URINE, CATHETERIZED  Final   Special Requests NONE  Final   Culture   Final    NO GROWTH Performed at [facility], [address], [place], [place] [address]    Report Status 05/24/2020 FINAL  Final    Radiology Reports CT ABDOMEN PELVIS WO CONTRAST  Result Date: 05/24/2020 CLINICAL DATA:  Renal mass. EXAM: CT ABDOMEN AND PELVIS WITHOUT CONTRAST TECHNIQUE: Multidetector CT imaging of the abdomen and pelvis was performed following the standard protocol without IV contrast. COMPARISON:  Renal ultrasound dated 05/22/2020. FINDINGS: Lower chest: Patchy ground-glass opacities at the bilateral lung  bases. Hepatobiliary: No focal liver abnormality is seen. Gallbladder is not seen, decompressed versus cholecystectomy. No intrahepatic bile duct dilatation appreciated. Pancreas: Unremarkable. Spleen: Normal in size without focal abnormality. Adrenals/Urinary Tract: Large heterogeneous mass of the LEFT kidney obscuring all normal features of the LEFT kidney,  measuring approximately 11 cm craniocaudal dimension, 10.6 cm AP dimension and nearly 15 cm transverse dimension across the midline. The rightward extension across the midline is concerning for involvement of the renal vein and IVC. RIGHT kidney is unremarkable. Stomach/Bowel: No dilated large or small bowel loops are seen. Stomach is moderately distended but otherwise unremarkable. Vascular/Lymphatic: Probable lymphadenopathy within the LEFT periaortic retroperitoneum (series 4, image 40). No abdominal aortic aneurysm. Reproductive: Prostate is unremarkable. Other: Small amount of free fluid in the lower pelvis. No free intraperitoneal air. Musculoskeletal: No acute or suspicious osseous finding. IMPRESSION: 1. Large heterogeneous mass of the LEFT kidney, obscuring all normal features of the LEFT kidney, measuring approximately 15 cm greatest dimension. The mass extends to the RIGHT of midline and this rightward extension across the midline is highly suspicious for involvement of the LEFT renal vein and possibly the IVC. Recommend MRI for further characterization if/when patient is able to tolerate breath hold technique. 2. Probable metastatic lymphadenopathy within the LEFT periaortic retroperitoneum. 3. Small amount of free fluid in the lower pelvis. 4. Patchy ground-glass opacities at the bilateral lung bases. Differential includes atypical pneumonias such as viral or fungal, interstitial pneumonias, edema related to volume overload/CHF, hypersensitivity pneumonitis, and respiratory bronchiolitis. Favor COVID-19 pneumonia. Electronically Signed   By: Franki Cabot M.D.   On: 05/24/2020 19:55   NM Pulmonary Perfusion  Result Date: 05/22/2020 CLINICAL DATA:  PE suspected, low to intermediate probability, positive D-dimer EXAM: NUCLEAR MEDICINE PERFUSION LUNG SCAN TECHNIQUE: Perfusion images were obtained in multiple projections after intravenous injection of radiopharmaceutical. Ventilation scans intentionally deferred if perfusion scan and chest x-ray adequate for interpretation during COVID 19 epidemic. RADIOPHARMACEUTICALS:  4.4  mCi Tc-63m MAA IV COMPARISON:  Radiograph 05/21/2020 FINDINGS: There are several nonsegmental perfusion defects predominantly within the right and left mid lungs corresponding well to areas of opacity seen on comparison chest radiograph. No large segmental or isolated perfusion defects are identified. IMPRESSION: Per the modified perfusion-only PIOPED II criteria, pulmonary embolism is absent (normal or very low probability). Electronically Signed   By: Lovena Le M.D.   On: 05/22/2020 18:35   US RENAL  Result Date: 05/22/2020 CLINICAL DATA:  Acute kidney injury EXAM: RENAL / URINARY TRACT ULTRASOUND COMPLETE COMPARISON:  None. FINDINGS: Right Kidney: Renal measurements: 11.7 x 5.9 x 6.3 cm = volume: 229 mL. Minimal right hydronephrosis and prominence of the proximal ureter. There is increased renal parenchymal echogenicity. Small amount of non organized perinephric fluid. No shadowing calculus or focal lesion. Left Kidney: Renal measurements: 15.4 x 9.8 x 12.2 cm = volume: 965 mL. Lobulated renal contours. Heterogeneous solid mass in the left kidney measures 11.2 x 9.4 x 12 cm. Mild to moderate left hydronephrosis and proximal hydroureter. Heterogeneous fluid in the renal collecting system. There is increased renal parenchymal echogenicity. Bladder: Debris in the urinary bladder.  There is no bladder wall thickening. Other: None. IMPRESSION: 1. Large solid mass this acted in the left kidney measuring greater than 10 cm, not well  assessed by ultrasound. Recommend further evaluation with renal protocol CT or MRI. If patient is not a candidate for IV contrast given acute renal injury, MRI is recommended. This should only be obtained if patient is able to tolerate breath hold technique. 2. Mild right hydronephrosis and mild to moderate left hydronephrosis. Heterogeneous fluid in the left renal collecting system. Query urinary tract infection. 3. Increased bilateral renal parenchymal echogenicity suggesting chronic medical renal disease. 4. Debris in the urinary bladder. No bladder  wall thickening. Electronically Signed   By: Keith Rake M.D.   On: 05/22/2020 17:21   DG Chest Port 1 View  Result Date: 05/24/2020 CLINICAL DATA:  COVID-19 pneumonia EXAM: PORTABLE CHEST 1 VIEW COMPARISON:  A prior chest x-ray 05/23/2020 FINDINGS: Stable appearance of the lungs with patchy airspace opacities in the bilateral upper and mid lungs. This is superimposed on a background of chronic bronchitic and emphysematous changes. Cardiac and mediastinal contours remain within normal limits. No pneumothorax, pleural effusion or pulmonary edema. IMPRESSION: No significant interval change in the appearance of the chest. Persistent bilateral patchy airspace opacities in the upper and mid lungs consistent with multifocal COVID pneumonia. Electronically Signed   By: Jacqulynn Cadet M.D.   On: 05/24/2020 08:42   DG Chest Port 1 View  Result Date: 05/23/2020 CLINICAL DATA:  COVID. EXAM: PORTABLE CHEST 1 VIEW COMPARISON:  Two days ago FINDINGS: Bilateral interstitial and airspace density which is stable to mildly progressed from before. No air leak or effusion seen. Normal heart size and mediastinal contours. IMPRESSION: Stable or mildly progressed bilateral pneumonia. Electronically Signed   By: Monte Fantasia M.D.   On: 05/23/2020 09:05   DG Chest Portable 1 View  Result Date: 05/21/2020 CLINICAL DATA:  Cough and throat pain. COVID positive. EXAM: PORTABLE  CHEST 1 VIEW COMPARISON:  None. FINDINGS: Very mild hazy areas of early infiltrate and/or atelectasis are seen along the periphery of the mid and lower left lung there is no evidence of a pleural effusion or pneumothorax. The heart size and mediastinal contours are within normal limits. The visualized skeletal structures are unremarkable. IMPRESSION: Very mild mid and lower left lung infiltrate and/or atelectasis. Electronically Signed   By: Virgina Norfolk M.D.   On: 05/21/2020 23:06   VAS [place] LOWER EXTREMITY VENOUS (DVT)  Result Date: 05/24/2020  Lower Venous DVTStudy Indications: Elevated D-dimer.  Comparison Study: No prior studies. Performing Technologist: Darlin Coco  Examination Guidelines: A complete evaluation includes B-mode imaging, spectral Doppler, color Doppler, and power Doppler as needed of all accessible portions of each vessel. Bilateral testing is considered an integral part of a complete examination. Limited examinations for reoccurring indications may be performed as noted. The reflux portion of the exam is performed with the patient in reverse Trendelenburg.  +---------+---------------+---------+-----------+----------+--------------+ RIGHT    CompressibilityPhasicitySpontaneityPropertiesThrombus Aging +---------+---------------+---------+-----------+----------+--------------+ CFV      Full           Yes      Yes                                 +---------+---------------+---------+-----------+----------+--------------+ SFJ      Full                                                        +---------+---------------+---------+-----------+----------+--------------+ FV Prox  Full                                                        +---------+---------------+---------+-----------+----------+--------------+ FV Mid   Full                                                        +---------+---------------+---------+-----------+----------+--------------+  FV  DistalFull                                                        +---------+---------------+---------+-----------+----------+--------------+ PFV      Full                                                        +---------+---------------+---------+-----------+----------+--------------+ POP      Full           Yes      Yes                                 +---------+---------------+---------+-----------+----------+--------------+ PTV      Full                                                        +---------+---------------+---------+-----------+----------+--------------+ PERO     Full                                                        +---------+---------------+---------+-----------+----------+--------------+   +---------+---------------+---------+-----------+----------+--------------+ LEFT     CompressibilityPhasicitySpontaneityPropertiesThrombus Aging +---------+---------------+---------+-----------+----------+--------------+ CFV      Full           Yes      Yes                                 +---------+---------------+---------+-----------+----------+--------------+ SFJ      Full                                                        +---------+---------------+---------+-----------+----------+--------------+ FV Prox  Full                                                        +---------+---------------+---------+-----------+----------+--------------+ FV Mid   Full                                                        +---------+---------------+---------+-----------+----------+--------------+ FV DistalFull                                                        +---------+---------------+---------+-----------+----------+--------------+  PFV      Full                                                        +---------+---------------+---------+-----------+----------+--------------+ POP      Full           Yes      Yes                                  +---------+---------------+---------+-----------+----------+--------------+ PTV      Full                                                        +---------+---------------+---------+-----------+----------+--------------+ PERO     Full                                                        +---------+---------------+---------+-----------+----------+--------------+     Summary: RIGHT: - There is no evidence of deep vein thrombosis in the lower extremity.  - No cystic structure found in the popliteal fossa.  LEFT: - There is no evidence of deep vein thrombosis in the lower extremity.  - No cystic structure found in the popliteal fossa.  *See table(s) above for measurements and observations. Electronically signed by Ruta Hinds MD on 05/24/2020 at 10:13:33 AM.    Final    ECHOCARDIOGRAM LIMITED  Result Date: 05/24/2020    ECHOCARDIOGRAM REPORT   Patient Name:   ORVILLE MENA Date of Exam: 05/24/2020 Medical Rec #:  [id number]      Height:       69.0 in Accession #:    [id number]     Weight:       146.4 lb Date of Birth:  May 20, 1956     BSA:          1.809 m Patient Age:    15 years       BP:           117/74 mmHg Patient Gender: M              HR:           84 bpm. Exam Location:  Inpatient Procedure: Limited Echo, Cardiac Doppler and Color Doppler Indications:    CHF-Acute Diastolic  History:        Patient has no prior history of Echocardiogram examinations.  Sonographer:    Clayton Lefort RDCS (AE) Referring Phys: [id number] [place]  1. Left ventricular ejection fraction, by estimation, is 60 to 65%. The left ventricle has normal function. The left ventricle has no regional wall motion abnormalities. Left ventricular diastolic parameters were normal.  2. Right ventricular systolic function is normal. The right ventricular size is normal. There is normal pulmonary artery systolic pressure.  3. The mitral valve is normal in structure. No evidence of mitral valve  regurgitation. No evidence of mitral stenosis.  4. The aortic valve is tricuspid. Aortic valve regurgitation is  not visualized. No aortic stenosis is present.  5. The inferior vena cava is normal in size with greater than 50% respiratory variability, suggesting right atrial pressure of 3 mmHg. FINDINGS  Left Ventricle: Left ventricular ejection fraction, by estimation, is 60 to 65%. The left ventricle has normal function. The left ventricle has no regional wall motion abnormalities. The left ventricular internal cavity size was normal in size. There is  borderline left ventricular hypertrophy. Left ventricular diastolic parameters were normal. Right Ventricle: The right ventricular size is normal. No increase in right ventricular wall thickness. Right ventricular systolic function is normal. There is normal pulmonary artery systolic pressure. The tricuspid regurgitant velocity is 2.38 m/s, and  with an assumed right atrial pressure of 3 mmHg, the estimated right ventricular systolic pressure is 44.9 mmHg. Left Atrium: Left atrial size was normal in size. Right Atrium: Right atrial size was normal in size. Pericardium: There is no evidence of pericardial effusion. Mitral Valve: The mitral valve is normal in structure. No evidence of mitral valve regurgitation. No evidence of mitral valve stenosis. Tricuspid Valve: The tricuspid valve is normal in structure. Tricuspid valve regurgitation is mild. Aortic Valve: The aortic valve is tricuspid. Aortic valve regurgitation is not visualized. No aortic stenosis is present. Pulmonic Valve: The pulmonic valve was grossly normal. Pulmonic valve regurgitation is mild. Aorta: The aortic root and ascending aorta are structurally normal, with no evidence of dilitation. Venous: The inferior vena cava is normal in size with greater than 50% respiratory variability, suggesting right atrial pressure of 3 mmHg. IAS/Shunts: No atrial level shunt detected by color flow Doppler.  LEFT  VENTRICLE PLAX 2D LVIDd:         5.00 cm  Diastology LVIDs:         3.20 cm  LV e' lateral:   13.20 cm/s LV PW:         1.20 cm  LV E/e' lateral: 5.9 LV IVS:        1.10 cm  LV e' medial:    9.79 cm/s LVOT diam:     2.20 cm  LV E/e' medial:  7.9 LVOT Area:     3.80 cm  IVC IVC diam: 0.70 cm LEFT ATRIUM         Index LA diam:    2.90 cm 1.60 cm/m   AORTA Ao Root diam: 3.40 cm Ao Asc diam:  3.30 cm MITRAL VALVE               TRICUSPID VALVE MV Area (PHT): 3.37 cm    TR Peak grad:   22.7 mmHg MV Decel Time: 225 msec    TR Vmax:        238.00 cm/s MV E velocity: 77.60 cm/s MV A velocity: 69.00 cm/s  SHUNTS MV E/A ratio:  1.12        Systemic Diam: 2.20 cm Buford Dresser MD Electronically signed by Buford Dresser MD Signature Date/Time: 05/24/2020/2:47:44 PM    Final

## 2020-05-26 DIAGNOSIS — J1282 Pneumonia due to coronavirus disease 2019: Secondary | ICD-10-CM | POA: Diagnosis not present

## 2020-05-26 DIAGNOSIS — U071 COVID-19: Secondary | ICD-10-CM | POA: Diagnosis not present

## 2020-05-26 LAB — CBC WITH DIFFERENTIAL/PLATELET
Abs Immature Granulocytes: 0 10*3/uL (ref 0.00–0.07)
Basophils Absolute: 0 10*3/uL (ref 0.0–0.1)
Basophils Relative: 0 %
Eosinophils Absolute: 0 10*3/uL (ref 0.0–0.5)
Eosinophils Relative: 0 %
HCT: 30.8 % — ABNORMAL LOW (ref 39.0–52.0)
Hemoglobin: 9.8 g/dL — ABNORMAL LOW (ref 13.0–17.0)
Lymphocytes Relative: 5 %
Lymphs Abs: 0.2 10*3/uL — ABNORMAL LOW (ref 0.7–4.0)
MCH: 25.1 pg — ABNORMAL LOW (ref 26.0–34.0)
MCHC: 31.8 g/dL (ref 30.0–36.0)
MCV: 79 fL — ABNORMAL LOW (ref 80.0–100.0)
Monocytes Absolute: 0 10*3/uL — ABNORMAL LOW (ref 0.1–1.0)
Monocytes Relative: 1 %
Neutro Abs: 4.2 10*3/uL (ref 1.7–7.7)
Neutrophils Relative %: 94 %
Platelets: 333 10*3/uL (ref 150–400)
RBC: 3.9 MIL/uL — ABNORMAL LOW (ref 4.22–5.81)
RDW: 15.5 % (ref 11.5–15.5)
WBC: 4.5 10*3/uL (ref 4.0–10.5)
nRBC: 0 % (ref 0.0–0.2)
nRBC: 0 /100 WBC

## 2020-05-26 LAB — BRAIN NATRIURETIC PEPTIDE: B Natriuretic Peptide: 389.2 pg/mL — ABNORMAL HIGH (ref 0.0–100.0)

## 2020-05-26 LAB — COMPREHENSIVE METABOLIC PANEL
ALT: 50 U/L — ABNORMAL HIGH (ref 0–44)
AST: 36 U/L (ref 15–41)
Albumin: 1.9 g/dL — ABNORMAL LOW (ref 3.5–5.0)
Alkaline Phosphatase: 31 U/L — ABNORMAL LOW (ref 38–126)
Anion gap: 11 (ref 5–15)
BUN: 50 mg/dL — ABNORMAL HIGH (ref 8–23)
CO2: 23 mmol/L (ref 22–32)
Calcium: 8.6 mg/dL — ABNORMAL LOW (ref 8.9–10.3)
Chloride: 109 mmol/L (ref 98–111)
Creatinine, Ser: 1.83 mg/dL — ABNORMAL HIGH (ref 0.61–1.24)
GFR calc Af Amer: 44 mL/min — ABNORMAL LOW (ref >60)
GFR calc non Af Amer: 38 mL/min — ABNORMAL LOW (ref >60)
Glucose, Bld: 115 mg/dL — ABNORMAL HIGH (ref 70–99)
Potassium: 4 mmol/L (ref 3.5–5.1)
Sodium: 143 mmol/L (ref 135–145)
Total Bilirubin: 0.7 mg/dL (ref 0.3–1.2)
Total Protein: 5.4 g/dL — ABNORMAL LOW (ref 6.5–8.1)

## 2020-05-26 LAB — PROCALCITONIN: Procalcitonin: <0.1 ng/mL

## 2020-05-26 LAB — C-REACTIVE PROTEIN: CRP: 5.8 mg/dL — ABNORMAL HIGH (ref <1.0)

## 2020-05-26 LAB — MAGNESIUM: Magnesium: 2.2 mg/dL (ref 1.7–2.4)

## 2020-05-26 LAB — TSH: TSH: 0.345 u[IU]/mL — ABNORMAL LOW (ref 0.350–4.500)

## 2020-05-26 LAB — FERRITIN: Ferritin: 474 ng/mL — ABNORMAL HIGH (ref 24–336)

## 2020-05-26 LAB — PHOSPHORUS: Phosphorus: 3.1 mg/dL (ref 2.5–4.6)

## 2020-05-26 LAB — D-DIMER, QUANTITATIVE: D-Dimer, Quant: 8.12 ug/mL-FEU — ABNORMAL HIGH (ref 0.00–0.50)

## 2020-05-26 MED ORDER — ALBUTEROL SULFATE HFA 108 (90 BASE) MCG/ACT IN AERS
2.0000 | INHALATION_SPRAY | Freq: Four times a day (QID) | RESPIRATORY_TRACT | 0 refills | Status: DC | PRN
Start: 2020-05-26 — End: 2022-12-30

## 2020-05-26 MED ORDER — FUROSEMIDE 10 MG/ML IJ SOLN
20.0000 mg | Freq: Once | INTRAMUSCULAR | Status: AC
  Administered 2020-05-26: 20 mg via INTRAVENOUS
  Filled 2020-05-26: qty 2

## 2020-05-26 MED ORDER — METHYLPREDNISOLONE SODIUM SUCC 40 MG IJ SOLR
20.0000 mg | Freq: Every day | INTRAMUSCULAR | Status: DC
  Administered 2020-05-26: 20 mg via INTRAVENOUS
  Filled 2020-05-26: qty 1

## 2020-05-26 MED ORDER — APIXABAN 2.5 MG PO TABS: 2.5000 mg | ORAL_TABLET | Freq: Two times a day (BID) | ORAL | 0 refills | Status: DC

## 2020-05-26 NOTE — Discharge Summary (Signed)
[name] ZHG:[id number] DOB: April 08, 1956 DOA: 05/21/2020  PCP: Patient, No Pcp Per  Admit date: 05/21/2020  Discharge date: 05/26/2020  Admitted From: Home   Disposition:  Home   Recommendations for Outpatient Follow-up:   Follow up with PCP in 1-2 weeks  PCP Please obtain BMP/CBC, 2 view CXR in 1week,  (see Discharge instructions)   PCP Please follow up on the following pending results: Needs MRI of the abdomen and pelvis, close urology follow-up, kindly see CT scan report below in detail.  Check CBC, CMP, TSH, phosphorus, magnesium, B12 in 7 to 10 days along with two-view chest x-ray.   Home Health: PT,RN   Equipment/Devices: 2lit o2 PRN  Consultations: None  Discharge Condition: Fair   CODE STATUS: Full    Diet Recommendation: Heart Healthy   Diet Order            Diet regular Room service appropriate? Yes; Fluid consistency: Thin  Diet effective now                  Chief Complaint  Patient presents with  . Cough    COVID+     Brief history of present illness from the day of admission and additional interim summary     Gregory Alexanderis a 64 y.o.malewith past medical historysignificant for alcohol and vape, who is not vaccinated for COVID-19, use presented with complaints ofworsening cough and weakness over the last 5-6 weeks, the ER work-up suggestive of acute hypoxic respiratory failure due to COVID-19 pneumonia, elevated D-dimer, renal failure, left renal mass with hydronephrosis, urology was consulted and he was admitted to the hospital.                                                                 Hospital Course   1. Acute Hypoxic Resp. Failure due to Acute Covid 19 Viral Pneumonitis during the ongoing 2020 Covid 19 Pandemic - he has moderate disease, he is treated with IV steroids  and Remdesivir, at rest now he is on room air and symptom-free, does require oxygen upon ambulation which will be ordered, clinically much improved, will be given 2 L of oxygen as needed along with a rescue inhaler and discharged home with outpatient PCP follow-up.     Recent Labs  Lab 05/21/20 1915 05/22/20 0850 05/23/20 0513 05/24/20 0236 05/24/20 0237 05/25/20 0252 05/25/20 0253 05/26/20 0422  CRP  --  22.5* 19.3* 13.7*  --  7.0*  --  5.8*  DDIMER  --  4.31*  --   --   --   --   --  8.12*  FERRITIN  --  967* 1,040* 1,123*  --  681*  --  474*  BNP  --  62.9 204.6*  --  147.8*  --  190.2* 389.2*  PROCALCITON  --  1.38 1.18 0.50  --  0.24  --  <0.10  SARSCOV2NAA POSITIVE*  --   --   --   --   --   --   --     Hepatic Function Latest Ref Rng & Units 05/26/2020 05/25/2020 05/24/2020  Total Protein 6.5 - 8.1 g/dL 5.4(L) 5.4(L) 6.4(L)  Albumin 3.5 - 5.0 g/dL 1.9(L) 1.9(L) 2.2(L)  AST 15 - 41 U/L 36 46(H) 57(H)  ALT 0 - 44 U/L 50(H) 54(H) 56(H)  Alk Phosphatase 38 - 126 U/L 31(L) 36(L) 35(L)  Total Bilirubin 0.3 - 1.2 mg/dL 0.7 0.4 0.5     2.  Left renal mass with hydronephrosis and renal failure.  Urology has been consulted, continue Foley catheter, will be discharged on Foley with outpatient urology follow-up.  Will require MRI of the abdomen pelvis in the outpatient setting, currently recovering from Covid and cannot hold breath for prolonged.  Or lay flat for appropriate.  3.  Renal failure.  No previous labs in record, no healthcare follow-up in several years, this could [place] his baseline CKD 3B.  Outpatient urology and PCP follow-up recommended.  4.  Elevated D-dimer.  Likely due to inflammation, VQ negative, leg ultrasound negative, risk of developing a clot is high due to COVID-19 inflammation and will be given 2 weeks of prophylactic Eliquis upon discharge.  5.  Elevated BNP, clinically appears dehydrated although he does have some rails.  Acute echo.  Stable..  6.  History  of vaping.  2-3 bottles of beer every day, has not had any beer in 2 weeks according to him, no signs of DTs, monitor closely.  Counseled to quit all.    Will B12..  7.  Sick euthyroid syndrome.  Repeat TSH, free T4 and T3 by PCP in 7 to 10 days.  Discharge diagnosis     Principal Problem:   Pneumonia due to COVID-19 virus Active Problems:   Elevated troponin   Hypoalbuminemia   Microcytic hypochromic anemia   Acute respiratory failure with hypoxia (HCC)   Hyponatremia   Hypochloremia   Elevated AST (SGOT)    Discharge instructions    Discharge Instructions    Discharge instructions   Complete by: As directed    Follow with Primary MD and the recommended urologist in 7 days   Get CBC, CMP, phosphorus, TSH, 2 view Chest X ray -  checked next visit within 1 week by Primary MD    Activity: As tolerated with Full fall precautions use walker/cane & assistance as needed  Disposition Home    Diet: Heart Healthy   Special Instructions: If you have smoked or chewed Tobacco  in the last 2 yrs please stop smoking, stop any regular Alcohol  and or any Recreational drug use.  On your next visit with your primary care physician please Get Medicines reviewed and adjusted.  Please request your Prim.MD to go over all Hospital Tests and Procedure/Radiological results at the follow up, please get all Hospital records sent to your Prim MD by signing hospital release before you go home.  If you experience worsening of your admission symptoms, develop shortness of breath, life threatening emergency, suicidal or homicidal thoughts you must seek medical attention immediately by calling 911 or calling your MD immediately  if symptoms less severe.  You Must read complete instructions/literature along with all the possible adverse reactions/side effects for all the Medicines you take and that have been prescribed to you. Take any new Medicines after you have  completely understood and accpet all the  possible adverse reactions/side effects.   Increase activity slowly   Complete by: As directed    MyChart COVID-19 home monitoring program   Complete by: May 26, 2020    Is the patient willing to use the [place] for home monitoring?: Yes   Temperature monitoring   Complete by: May 26, 2020    After how many days would you like to receive a notification of this patient's flowsheet entries?: 1      Discharge Medications   Allergies as of 05/26/2020   No Known Allergies     Medication List    STOP taking these medications   ibuprofen 200 MG tablet Commonly known as: ADVIL     TAKE these medications   albuterol 108 (90 Base) MCG/ACT inhaler Commonly known as: VENTOLIN HFA Inhale 2 puffs into the lungs every 6 (six) hours as needed for wheezing or shortness of breath.   apixaban 2.5 MG Tabs tablet Commonly known as: Eliquis Take 1 tablet (2.5 mg total) by mouth 2 (two) times daily.            Durable Medical Equipment  (From admission, onward)         Start     Ordered   05/26/20 1022  For home use only DME Walker rolling  Once       Comments: 5 wheel  Question Answer Comment  Walker: With Hiller Wheels   Patient needs a walker to treat with the following condition Weakness      05/26/20 1021   05/26/20 1021  For home use only DME oxygen  Once       Question Answer Comment  Length of Need 6 Months   Mode or (Route) Nasal cannula   Liters per Minute 2   Frequency Continuous (stationary and portable oxygen unit needed)   Oxygen conserving device Yes   Oxygen delivery system Gas      05/26/20 1020           Follow-up Information    [facility]. Schedule an appointment as soon as possible for a visit in 1 week(s).   Contact information: [place] [phone] [phone]       Lucas Mallow, MD. Schedule an appointment as soon as possible for a visit in 1 week(s).     Specialty: Urology Why: Renal Mass Contact information: [place] [phone]        Gean Quint, MD. Schedule an appointment as soon as possible for a visit in 1 week(s).   Specialty: Nephrology Why: CKD 3 Contact information: [address] [place] [address] [phone]               Major procedures and Radiology Reports - PLEASE review detailed and final reports thoroughly  -       CT ABDOMEN PELVIS WO CONTRAST  Result Date: 05/24/2020 CLINICAL DATA:  Renal mass. EXAM: CT ABDOMEN AND PELVIS WITHOUT CONTRAST TECHNIQUE: Multidetector CT imaging of the abdomen and pelvis was performed following the standard protocol without IV contrast. COMPARISON:  Renal ultrasound dated 05/22/2020. FINDINGS: Lower chest: Patchy ground-glass opacities at the bilateral lung bases. Hepatobiliary: No focal liver abnormality is seen. Gallbladder is not seen, decompressed versus cholecystectomy. No intrahepatic bile duct dilatation appreciated. Pancreas: Unremarkable. Spleen: Normal in size without focal abnormality. Adrenals/Urinary Tract: Large heterogeneous mass of the LEFT kidney  obscuring all normal features of the LEFT kidney, measuring approximately 11 cm craniocaudal dimension, 10.6 cm AP dimension and nearly 15 cm transverse dimension across the midline. The rightward extension across the midline is concerning for involvement of the renal vein and IVC. RIGHT kidney is unremarkable. Stomach/Bowel: No dilated large or small bowel loops are seen. Stomach is moderately distended but otherwise unremarkable. Vascular/Lymphatic: Probable lymphadenopathy within the LEFT periaortic retroperitoneum (series 4, image 40). No abdominal aortic aneurysm. Reproductive: Prostate is unremarkable. Other: Small amount of free fluid in the lower pelvis. No free intraperitoneal air. Musculoskeletal: No acute or suspicious osseous finding. IMPRESSION: 1. Large heterogeneous mass of the  LEFT kidney, obscuring all normal features of the LEFT kidney, measuring approximately 15 cm greatest dimension. The mass extends to the RIGHT of midline and this rightward extension across the midline is highly suspicious for involvement of the LEFT renal vein and possibly the IVC. Recommend MRI for further characterization if/when patient is able to tolerate breath hold technique. 2. Probable metastatic lymphadenopathy within the LEFT periaortic retroperitoneum. 3. Small amount of free fluid in the lower pelvis. 4. Patchy ground-glass opacities at the bilateral lung bases. Differential includes atypical pneumonias such as viral or fungal, interstitial pneumonias, edema related to volume overload/CHF, hypersensitivity pneumonitis, and respiratory bronchiolitis. Favor COVID-19 pneumonia. Electronically Signed   By: Franki Cabot M.D.   On: 05/24/2020 19:55   NM Pulmonary Perfusion  Result Date: 05/22/2020 CLINICAL DATA:  PE suspected, low to intermediate probability, positive D-dimer EXAM: NUCLEAR MEDICINE PERFUSION LUNG SCAN TECHNIQUE: Perfusion images were obtained in multiple projections after intravenous injection of radiopharmaceutical. Ventilation scans intentionally deferred if perfusion scan and chest x-ray adequate for interpretation during COVID 19 epidemic. RADIOPHARMACEUTICALS:  4.4  mCi Tc-58mMAA IV COMPARISON:  Radiograph 05/21/2020 FINDINGS: There are several nonsegmental perfusion defects predominantly within the right and left mid lungs corresponding well to areas of opacity seen on comparison chest radiograph. No large segmental or isolated perfusion defects are identified. IMPRESSION: Per the modified perfusion-only PIOPED II criteria, pulmonary embolism is absent (normal or very low probability). Electronically Signed   By: PLovena LeM.D.   On: 05/22/2020 18:35   [place]NAL  Result Date: 05/22/2020 CLINICAL DATA:  Acute kidney injury EXAM: RENAL / URINARY TRACT ULTRASOUND COMPLETE  COMPARISON:  None. FINDINGS: Right Kidney: Renal measurements: 11.7 x 5.9 x 6.3 cm = volume: 229 mL. Minimal right hydronephrosis and prominence of the proximal ureter. There is increased renal parenchymal echogenicity. Small amount of non organized perinephric fluid. No shadowing calculus or focal lesion. Left Kidney: Renal measurements: 15.4 x 9.8 x 12.2 cm = volume: 965 mL. Lobulated renal contours. Heterogeneous solid mass in the left kidney measures 11.2 x 9.4 x 12 cm. Mild to moderate left hydronephrosis and proximal hydroureter. Heterogeneous fluid in the renal collecting system. There is increased renal parenchymal echogenicity. Bladder: Debris in the urinary bladder.  There is no bladder wall thickening. Other: None. IMPRESSION: 1. Large solid mass this acted in the left kidney measuring greater than 10 cm, not well assessed by ultrasound. Recommend further evaluation with renal protocol CT or MRI. If patient is not a candidate for IV contrast given acute renal injury, MRI is recommended. This should only be obtained if patient is able to tolerate breath hold technique. 2. Mild right hydronephrosis and mild to moderate left hydronephrosis. Heterogeneous fluid in the left renal collecting system. Query urinary tract infection. 3. Increased bilateral renal parenchymal echogenicity suggesting chronic medical renal disease.  4. Debris in the urinary bladder. No bladder wall thickening. Electronically Signed   By: Keith Rake M.D.   On: 05/22/2020 17:21   DG Chest Port 1 View  Result Date: 05/24/2020 CLINICAL DATA:  COVID-19 pneumonia EXAM: PORTABLE CHEST 1 VIEW COMPARISON:  A prior chest x-ray 05/23/2020 FINDINGS: Stable appearance of the lungs with patchy airspace opacities in the bilateral upper and mid lungs. This is superimposed on a background of chronic bronchitic and emphysematous changes. Cardiac and mediastinal contours remain within normal limits. No pneumothorax, pleural effusion or pulmonary  edema. IMPRESSION: No significant interval change in the appearance of the chest. Persistent bilateral patchy airspace opacities in the upper and mid lungs consistent with multifocal COVID pneumonia. Electronically Signed   By: Jacqulynn Cadet M.D.   On: 05/24/2020 08:42   DG Chest Port 1 View  Result Date: 05/23/2020 CLINICAL DATA:  COVID. EXAM: PORTABLE CHEST 1 VIEW COMPARISON:  Two days ago FINDINGS: Bilateral interstitial and airspace density which is stable to mildly progressed from before. No air leak or effusion seen. Normal heart size and mediastinal contours. IMPRESSION: Stable or mildly progressed bilateral pneumonia. Electronically Signed   By: Monte Fantasia M.D.   On: 05/23/2020 09:05   DG Chest Portable 1 View  Result Date: 05/21/2020 CLINICAL DATA:  Cough and throat pain. COVID positive. EXAM: PORTABLE CHEST 1 VIEW COMPARISON:  None. FINDINGS: Very mild hazy areas of early infiltrate and/or atelectasis are seen along the periphery of the mid and lower left lung there is no evidence of a pleural effusion or pneumothorax. The heart size and mediastinal contours are within normal limits. The visualized skeletal structures are unremarkable. IMPRESSION: Very mild mid and lower left lung infiltrate and/or atelectasis. Electronically Signed   By: Virgina Norfolk M.D.   On: 05/21/2020 23:06   VAS [place] LOWER EXTREMITY VENOUS (DVT)  Result Date: 05/24/2020  Lower Venous DVTStudy Indications: Elevated D-dimer.  Comparison Study: No prior studies. Performing Technologist: Darlin Coco  Examination Guidelines: A complete evaluation includes B-mode imaging, spectral Doppler, color Doppler, and power Doppler as needed of all accessible portions of each vessel. Bilateral testing is considered an integral part of a complete examination. Limited examinations for reoccurring indications may be performed as noted. The reflux portion of the exam is performed with the patient in reverse Trendelenburg.   +---------+---------------+---------+-----------+----------+--------------+ RIGHT    CompressibilityPhasicitySpontaneityPropertiesThrombus Aging +---------+---------------+---------+-----------+----------+--------------+ CFV      Full           Yes      Yes                                 +---------+---------------+---------+-----------+----------+--------------+ SFJ      Full                                                        +---------+---------------+---------+-----------+----------+--------------+ FV Prox  Full                                                        +---------+---------------+---------+-----------+----------+--------------+ FV Mid   Full                                                        +---------+---------------+---------+-----------+----------+--------------+  FV DistalFull                                                        +---------+---------------+---------+-----------+----------+--------------+ PFV      Full                                                        +---------+---------------+---------+-----------+----------+--------------+ POP      Full           Yes      Yes                                 +---------+---------------+---------+-----------+----------+--------------+ PTV      Full                                                        +---------+---------------+---------+-----------+----------+--------------+ PERO     Full                                                        +---------+---------------+---------+-----------+----------+--------------+   +---------+---------------+---------+-----------+----------+--------------+ LEFT     CompressibilityPhasicitySpontaneityPropertiesThrombus Aging +---------+---------------+---------+-----------+----------+--------------+ CFV      Full           Yes      Yes                                  +---------+---------------+---------+-----------+----------+--------------+ SFJ      Full                                                        +---------+---------------+---------+-----------+----------+--------------+ FV Prox  Full                                                        +---------+---------------+---------+-----------+----------+--------------+ FV Mid   Full                                                        +---------+---------------+---------+-----------+----------+--------------+ FV DistalFull                                                        +---------+---------------+---------+-----------+----------+--------------+  PFV      Full                                                        +---------+---------------+---------+-----------+----------+--------------+ POP      Full           Yes      Yes                                 +---------+---------------+---------+-----------+----------+--------------+ PTV      Full                                                        +---------+---------------+---------+-----------+----------+--------------+ PERO     Full                                                        +---------+---------------+---------+-----------+----------+--------------+     Summary: RIGHT: - There is no evidence of deep vein thrombosis in the lower extremity.  - No cystic structure found in the popliteal fossa.  LEFT: - There is no evidence of deep vein thrombosis in the lower extremity.  - No cystic structure found in the popliteal fossa.  *See table(s) above for measurements and observations. Electronically signed by Ruta Hinds MD on 05/24/2020 at 10:13:33 AM.    Final    ECHOCARDIOGRAM LIMITED  Result Date: 05/24/2020    ECHOCARDIOGRAM REPORT   Patient Name:   [name] Date of Exam: 05/24/2020 Medical Rec #:  [id number]      Height:       69.0 in Accession #:    [id number]     Weight:       146.4 lb Date  of Birth:  10-Aug-1956     BSA:          1.809 m Patient Age:    16 years       BP:           117/74 mmHg Patient Gender: M              HR:           84 bpm. Exam Location:  Inpatient Procedure: Limited Echo, Cardiac Doppler and Color Doppler Indications:    CHF-Acute Diastolic  History:        Patient has no prior history of Echocardiogram examinations.  Sonographer:    Clayton Lefort RDCS (AE) Referring Phys: [id number] [place]  1. Left ventricular ejection fraction, by estimation, is 60 to 65%. The left ventricle has normal function. The left ventricle has no regional wall motion abnormalities. Left ventricular diastolic parameters were normal.  2. Right ventricular systolic function is normal. The right ventricular size is normal. There is normal pulmonary artery systolic pressure.  3. The mitral valve is normal in structure. No evidence of mitral valve regurgitation. No evidence of mitral stenosis.  4. The aortic valve is tricuspid. Aortic valve regurgitation is not  visualized. No aortic stenosis is present.  5. The inferior vena cava is normal in size with greater than 50% respiratory variability, suggesting right atrial pressure of 3 mmHg. FINDINGS  Left Ventricle: Left ventricular ejection fraction, by estimation, is 60 to 65%. The left ventricle has normal function. The left ventricle has no regional wall motion abnormalities. The left ventricular internal cavity size was normal in size. There is  borderline left ventricular hypertrophy. Left ventricular diastolic parameters were normal. Right Ventricle: The right ventricular size is normal. No increase in right ventricular wall thickness. Right ventricular systolic function is normal. There is normal pulmonary artery systolic pressure. The tricuspid regurgitant velocity is 2.38 m/s, and  with an assumed right atrial pressure of 3 mmHg, the estimated right ventricular systolic pressure is 08.6 mmHg. Left Atrium: Left atrial size was normal in  size. Right Atrium: Right atrial size was normal in size. Pericardium: There is no evidence of pericardial effusion. Mitral Valve: The mitral valve is normal in structure. No evidence of mitral valve regurgitation. No evidence of mitral valve stenosis. Tricuspid Valve: The tricuspid valve is normal in structure. Tricuspid valve regurgitation is mild. Aortic Valve: The aortic valve is tricuspid. Aortic valve regurgitation is not visualized. No aortic stenosis is present. Pulmonic Valve: The pulmonic valve was grossly normal. Pulmonic valve regurgitation is mild. Aorta: The aortic root and ascending aorta are structurally normal, with no evidence of dilitation. Venous: The inferior vena cava is normal in size with greater than 50% respiratory variability, suggesting right atrial pressure of 3 mmHg. IAS/Shunts: No atrial level shunt detected by color flow Doppler.  LEFT VENTRICLE PLAX 2D LVIDd:         5.00 cm  Diastology LVIDs:         3.20 cm  LV e' lateral:   13.20 cm/s LV PW:         1.20 cm  LV E/e' lateral: 5.9 LV IVS:        1.10 cm  LV e' medial:    9.79 cm/s LVOT diam:     2.20 cm  LV E/e' medial:  7.9 LVOT Area:     3.80 cm  IVC IVC diam: 0.70 cm LEFT ATRIUM         Index LA diam:    2.90 cm 1.60 cm/m   AORTA Ao Root diam: 3.40 cm Ao Asc diam:  3.30 cm MITRAL VALVE               TRICUSPID VALVE MV Area (PHT): 3.37 cm    TR Peak grad:   22.7 mmHg MV Decel Time: 225 msec    TR Vmax:        238.00 cm/s MV E velocity: 77.60 cm/s MV A velocity: 69.00 cm/s  SHUNTS MV E/A ratio:  1.12        Systemic Diam: 2.20 cm Buford Dresser MD Electronically signed by Buford Dresser MD Signature Date/Time: 05/24/2020/2:47:44 PM    Final     Micro Results     Recent Results (from the past 240 hour(s))  SARS Coronavirus 2 by RT PCR (hospital order, performed in [place] hospital lab) Nasopharyngeal Nasopharyngeal Swab     Status: Abnormal   Collection Time: 05/21/20  7:15 PM   Specimen: Nasopharyngeal  Swab  Result Value Ref Range Status   SARS Coronavirus 2 POSITIVE (A) NEGATIVE Final    Comment: RESULT CALLED TO, READ BACK BY AND VERIFIED WITH: K,MUNNETT RN _0  05/21/20 EB (NOTE) SARS-CoV-2 target  nucleic acids are DETECTED  SARS-CoV-2 RNA is generally detectable in upper respiratory specimens  during the acute phase of infection.  Positive results are indicative  of the presence of the identified virus, but do not rule out bacterial infection or co-infection with other pathogens not detected by the test.  Clinical correlation with patient history and  other diagnostic information is necessary to determine patient infection status.  The expected result is negative.  Fact Sheet for Patients:   StrictlyIdeas.no   Fact Sheet for Healthcare Providers:   BankingDealers.co.za    This test is not yet approved or cleared by the [place] FDA and  has been authorized for detection and/or diagnosis of SARS-CoV-2 by FDA under an Emergency Use Authorization (EUA).  This EUA will remain in effect (meaning this test can  be used) for the duration of  the COVID-19 declaration under Section 564(b)(1) of the Act, 21 U.S.C. section 360-bbb-3(b)(1), unless the authorization is terminated or revoked sooner.  Performed at [facility], [place] [address] [address] [place], [place] [address]   Culture, blood (single) w Reflex to ID Panel     Status: None (Preliminary result)   Collection Time: 05/22/20  8:50 AM   Specimen: BLOOD  Result Value Ref Range Status   Specimen Description BLOOD LEFT ANTECUBITAL  Final   Special Requests   Final    BOTTLES DRAWN AEROBIC AND ANAEROBIC Blood Culture adequate volume   Culture   Final    NO GROWTH 4 DAYS Performed at [facility], [place] [address] [place], [place] [address]    Report Status PENDING  Incomplete  Culture, Urine     Status: None   Collection Time: 05/22/20  7:56 PM   Specimen: Urine,  Catheterized  Result Value Ref Range Status   Specimen Description URINE, CATHETERIZED  Final   Special Requests NONE  Final   Culture   Final    NO GROWTH Performed at [facility], [address], [place], [place] [address]    Report Status 05/24/2020 FINAL  Final    Today   Subjective    [name] today has no headache,no chest abdominal pain,no new weakness tingling or numbness, feels much better wants to go home today.     Objective   Blood pressure 99/61, pulse (!) 59, temperature 98.1 F (36.7 C), temperature source Oral, resp. rate 20, height _0  (1.753 m), weight 66.4 kg, SpO2 90 %.   Intake/Output Summary (Last 24 hours) at 05/26/2020 1028 Last data filed at 05/26/2020 0934 Gross per 24 hour  Intake 800 ml  Output 1200 ml  Net -400 ml    Exam  Awake Alert, No new F.N deficits, Normal affect [place].AT,PERRAL Supple Neck,No JVD, No cervical lymphadenopathy appriciated.  Symmetrical Chest wall movement, Good air movement bilaterally, CTAB RRR,No Gallops,Rubs or new Murmurs, No Parasternal Heave +ve B.Sounds, Abd Soft, Non tender, No organomegaly appriciated, No rebound -guarding or rigidity. No Cyanosis, Clubbing or edema, No new Rash or bruise   Data Review   CBC w Diff:  Lab Results  Component Value Date   WBC 4.5 05/26/2020   HGB 9.8 (L) 05/26/2020   HCT 30.8 (L) 05/26/2020   PLT 333 05/26/2020   LYMPHOPCT 5 05/26/2020   MONOPCT 1 05/26/2020   EOSPCT 0 05/26/2020   BASOPCT 0 05/26/2020    CMP:  Lab Results  Component Value Date   NA 143 05/26/2020   K 4.0 05/26/2020  CL 109 05/26/2020   CO2 23 05/26/2020   BUN 50 (H) 05/26/2020   CREATININE 1.83 (H) 05/26/2020   PROT 5.4 (L) 05/26/2020   ALBUMIN 1.9 (L) 05/26/2020   BILITOT 0.7 05/26/2020   ALKPHOS 31 (L) 05/26/2020   AST 36 05/26/2020   ALT 50 (H) 05/26/2020  .   Total Time in preparing paper work, data evaluation and todays exam - 85 minutes  Lala Lund M.D on 05/26/2020  at 10:28 AM  [facility]   Office  [phone]

## 2020-05-26 NOTE — Discharge Instructions (Signed)
Follow with Primary MD and the recommended urologist in 7 days   Get CBC, CMP, phosphorus, TSH, 2 view Chest X ray -  checked next visit within 1 week by Primary MD    Activity: As tolerated with Full fall precautions use walker/cane & assistance as needed  Disposition Home    Diet: Heart Healthy   Special Instructions: If you have smoked or chewed Tobacco  in the last 2 yrs please stop smoking, stop any regular Alcohol  and or any Recreational drug use.  On your next visit with your primary care physician please Get Medicines reviewed and adjusted.  Please request your Prim.MD to go over all Hospital Tests and Procedure/Radiological results at the follow up, please get all Hospital records sent to your Prim MD by signing hospital release before you go home.  If you experience worsening of your admission symptoms, develop shortness of breath, life threatening emergency, suicidal or homicidal thoughts you must seek medical attention immediately by calling 911 or calling your MD immediately  if symptoms less severe.  You Must read complete instructions/literature along with all the possible adverse reactions/side effects for all the Medicines you take and that have been prescribed to you. Take any new Medicines after you have completely understood and accpet all the possible adverse reactions/side effects.      Person Under Monitoring Name: [name]  Location: [address] [place] [address]   Infection Prevention Recommendations for Individuals Confirmed to have, or Being Evaluated for, 2019 Novel Coronavirus (COVID-19) Infection Who Receive Care at Home  Individuals who are confirmed to have, or are being evaluated for, COVID-19 should follow the prevention steps below until a healthcare provider or local or state health department says they can return to normal activities.  Stay home except to get medical care You should restrict activities outside your home,  except for getting medical care. Do not go to work, school, or public areas, and do not use public transportation or taxis.  Call ahead before visiting your doctor Before your medical appointment, call the healthcare provider and tell them that you have, or are being evaluated for, COVID-19 infection. This will help the healthcare provider's office take steps to keep other people from getting infected. Ask your healthcare provider to call the local or state health department.  Monitor your symptoms Seek prompt medical attention if your illness is worsening (e.g., difficulty breathing). Before going to your medical appointment, call the healthcare provider and tell them that you have, or are being evaluated for, COVID-19 infection. Ask your healthcare provider to call the local or state health department.  Wear a facemask You should wear a facemask that covers your nose and mouth when you are in the same room with other people and when you visit a healthcare provider. People who live with or visit you should also wear a facemask while they are in the same room with you.  Separate yourself from other people in your home As much as possible, you should stay in a different room from other people in your home. Also, you should use a separate bathroom, if available.  Avoid sharing household items You should not share dishes, drinking glasses, cups, eating utensils, towels, bedding, or other items with other people in your home. After using these items, you should wash them thoroughly with soap and water.  Cover your coughs and sneezes Cover your mouth and nose with a tissue when you cough or sneeze, or you can cough or  sneeze into your sleeve. Throw used tissues in a lined trash can, and immediately wash your hands with soap and water for at least 20 seconds or use an alcohol-based hand rub.  Wash your [facility] your hands often and thoroughly with soap and water for at least 20 seconds.  You can use an alcohol-based hand sanitizer if soap and water are not available and if your hands are not visibly dirty. Avoid touching your eyes, nose, and mouth with unwashed hands.   Prevention Steps for Caregivers and Household Members of Individuals Confirmed to have, or Being Evaluated for, COVID-19 Infection Being Cared for in the Home  If you live with, or provide care at home for, a person confirmed to have, or being evaluated for, COVID-19 infection please follow these guidelines to prevent infection:  Follow healthcare provider's instructions Make sure that you understand and can help the patient follow any healthcare provider instructions for all care.  Provide for the patient's basic needs You should help the patient with basic needs in the home and provide support for getting groceries, prescriptions, and other personal needs.  Monitor the patient's symptoms If they are getting sicker, call his or her medical provider and tell them that the patient has, or is being evaluated for, COVID-19 infection. This will help the healthcare provider's office take steps to keep other people from getting infected. Ask the healthcare provider to call the local or state health department.  Limit the number of people who have contact with the patient  If possible, have only one caregiver for the patient.  Other household members should stay in another home or place of residence. If this is not possible, they should stay  in another room, or be separated from the patient as much as possible. Use a separate bathroom, if available.  Restrict visitors who do not have an essential need to be in the home.  Keep older adults, very young children, and other sick people away from the patient Keep older adults, very young children, and those who have compromised immune systems or chronic health conditions away from the patient. This includes people with chronic heart, lung, or kidney conditions,  diabetes, and cancer.  Ensure good ventilation Make sure that shared spaces in the home have good air flow, such as from an air conditioner or an opened window, weather permitting.  Wash your hands often  Wash your hands often and thoroughly with soap and water for at least 20 seconds. You can use an alcohol based hand sanitizer if soap and water are not available and if your hands are not visibly dirty.  Avoid touching your eyes, nose, and mouth with unwashed hands.  Use disposable paper towels to dry your hands. If not available, use dedicated cloth towels and replace them when they become wet.  Wear a facemask and gloves  Wear a disposable facemask at all times in the room and gloves when you touch or have contact with the patient's blood, body fluids, and/or secretions or excretions, such as sweat, saliva, sputum, nasal mucus, vomit, urine, or feces.  Ensure the mask fits over your nose and mouth tightly, and do not touch it during use.  Throw out disposable facemasks and gloves after using them. Do not reuse.  Wash your hands immediately after removing your facemask and gloves.  If your personal clothing becomes contaminated, carefully remove clothing and launder. Wash your hands after handling contaminated clothing.  Place all used disposable facemasks, gloves, and other waste  in a lined container before disposing them with other household waste.  Remove gloves and wash your hands immediately after handling these items.  Do not share dishes, glasses, or other household items with the patient  Avoid sharing household items. You should not share dishes, drinking glasses, cups, eating utensils, towels, bedding, or other items with a patient who is confirmed to have, or being evaluated for, COVID-19 infection.  After the person uses these items, you should wash them thoroughly with soap and water.  Wash laundry thoroughly  Immediately remove and wash clothes or bedding that have  blood, body fluids, and/or secretions or excretions, such as sweat, saliva, sputum, nasal mucus, vomit, urine, or feces, on them.  Wear gloves when handling laundry from the patient.  Read and follow directions on labels of laundry or clothing items and detergent. In general, wash and dry with the warmest temperatures recommended on the label.  Clean all areas the individual has used often  Clean all touchable surfaces, such as counters, tabletops, doorknobs, bathroom fixtures, toilets, phones, keyboards, tablets, and bedside tables, every day. Also, clean any surfaces that may have blood, body fluids, and/or secretions or excretions on them.  Wear gloves when cleaning surfaces the patient has come in contact with.  Use a diluted bleach solution (e.g., dilute bleach with 1 part bleach and 10 parts water) or a household disinfectant with a label that says EPA-registered for coronaviruses. To make a bleach solution at home, add 1 tablespoon of bleach to 1 quart (4 cups) of water. For a larger supply, add  cup of bleach to 1 gallon (16 cups) of water.  Read labels of cleaning products and follow recommendations provided on product labels. Labels contain instructions for safe and effective use of the cleaning product including precautions you should take when applying the product, such as wearing gloves or eye protection and making sure you have good ventilation during use of the product.  Remove gloves and wash hands immediately after cleaning.  Monitor yourself for signs and symptoms of illness Caregivers and household members are considered close contacts, should monitor their health, and will be asked to limit movement outside of the home to the extent possible. Follow the monitoring steps for close contacts listed on the symptom monitoring form.   ? If you have additional questions, contact your local health department or call the epidemiologist on call at [phone] (available 24/7). ?  This guidance is subject to change. For the most up-to-date guidance from [facility], please refer to their website: YouBlogs.pl  Information on my medicine - ELIQUIS (apixaban)  This medication education was reviewed with me or my healthcare representative as part of my discharge preparation.    Why was Eliquis prescribed for you? Eliquis was prescribed for you to reduce the risk of forming blood clots.  What do You need to know about Eliquis ? Take your Eliquis TWICE DAILY - one tablet in the morning and one tablet in the evening with or without food.  It would be best to take the doses about the same time each day.  If you have difficulty swallowing the tablet whole please discuss with your pharmacist how to take the medication safely.  Take Eliquis exactly as prescribed by your doctor and DO NOT stop taking Eliquis without talking to the doctor who prescribed the medication.  Stopping may increase your risk of developing a new clot or stroke.  Refill your prescription before you run out.  After discharge, you should have  regular check-up appointments with your healthcare provider that is prescribing your Eliquis.  In the future your dose may need to be changed if your kidney function or weight changes by a significant amount or as you get older.  What do you do if you miss a dose? If you miss a dose, take it as soon as you remember on the same day and resume taking twice daily.  Do not take more than one dose of ELIQUIS at the same time.  Important Safety Information A possible side effect of Eliquis is bleeding. You should call your healthcare provider right away if you experience any of the following: ? Bleeding from an injury or your nose that does not stop. ? Unusual colored urine (red or dark brown) or unusual colored stools (red or black). ? Unusual bruising for unknown reasons. ? A serious fall or if you hit your  head (even if there is no bleeding).  Some medicines may interact with Eliquis and might increase your risk of bleeding or clotting while on Eliquis. To help avoid this, consult your healthcare provider or pharmacist prior to using any new prescription or non-prescription medications, including herbals, vitamins, non-steroidal anti-inflammatory drugs (NSAIDs) and supplements.  This website has more information on Eliquis (apixaban): www.DubaiSkin.no.

## 2020-05-26 NOTE — TOC Progression Note (Signed)
Transition of Care [facility]) - Progression Note    Patient Details  Name: [name] MRN: [id number] Date of Birth: Jun 06, 1956  Transition of Care [facility]) CM/SW [place], RN Phone Number: 05/26/2020, 10:31 AM  Clinical Narrative:     Oxygen with [facility] ordered via DME adapt.  Patient is self pay and due to this, there are limitations for home health agencies. This CM is currently calling different companies to see if they do self pay and what is the associated charge.  Will be speaking to the patient regarding this.   Expected Discharge Plan: Home/Self Care Barriers to Discharge: Continued Medical Work up  Expected Discharge Plan and Services Expected Discharge Plan: Home/Self Care         Expected Discharge Date: 05/26/20               DME Arranged: Oxygen DME Agency: Other - Comment ([employer]) Date DME Agency Contacted: 05/26/20 Time DME Agency Contacted: 65 Representative spoke with at DME Agency: Melene Muller             Social Determinants of Health ([place]) Interventions    Readmission Risk Interventions No flowsheet data found.

## 2020-05-26 NOTE — Progress Notes (Signed)
SATURATION QUALIFICATIONS: (This note is used to comply with regulatory documentation for home oxygen)  Patient Saturations on Room Air at Rest = 88%  Patient Saturations on Room Air while Ambulating = 79%  Patient Saturations on 2 Liters of oxygen while Ambulating = 88%  Please briefly explain why patient needs home oxygen:

## 2020-05-26 NOTE — Progress Notes (Signed)
Patient has order to be discharged home. He is waiting for transportation.

## 2020-05-26 NOTE — Care Management (Signed)
Called patient  Room phone no answer, then called mobile/home phone. That has been disconnected to speak to patient regarding Home Health.

## 2020-05-26 NOTE — Progress Notes (Signed)
Patient was discharged home by MD order; discharged instructions  review and give to patient with care notes; IV DIC; skin intact; oxugen was delivered to patient's room; patient refused rolling-walker; patient will be escorted to the car by nurse via wheelchair.

## 2020-05-26 NOTE — Progress Notes (Signed)
Foley was removed this AM per MD order. Patient able to void after the catheter was removed.

## 2020-05-26 NOTE — TOC Transition Note (Signed)
Transition of Care [facility]) - CM/SW Discharge Note   Patient Details  Name: [name] MRN: [id number] Date of Birth: 09-16-1956  [facility]) CM/SW Contact:  Verdell Carmine, RN Phone Number: 05/26/2020, 12:19 PM   Clinical Narrative:    Patient being discharged today has home o2 by [employer]  rolling walker ordered by [employer].  Was not able to secure [facility] with any provider due to self-pay, contract issues.  Patient needs to make appointments with his PCP ( whom he did not list) or at the clinics on the patient instruction list.    Final next level of care: Home/Self Care Barriers to Discharge: No [facility] will accept this patient   Patient Goals and CMS Choice        Discharge Placement                       Discharge Plan and Services   Discharge Planning Services: CM Consult Post Acute Care Choice: Durable Medical Equipment          DME Arranged: Gilford Rile DME Agency: Other - Comment ([employer]) Date DME Agency Contacted: 05/26/20 Time DME Agency Contacted: [phone] Representative spoke with at DME Agency: Freda Munro HH Arranged: RN, PT [place] Agency:  (Unable to secure agency for self pay)     Representative spoke with at [place]: Unable to secure [facility] with self pay  Social Determinants of Health (SDOH) Interventions     Readmission Risk Interventions No flowsheet data found.

## 2020-05-27 LAB — CULTURE, BLOOD (SINGLE)
Culture: NO GROWTH
Special Requests: ADEQUATE

## 2020-07-29 ENCOUNTER — Other Ambulatory Visit (HOSPITAL_COMMUNITY): Payer: Self-pay | Admitting: Urology

## 2020-07-29 ENCOUNTER — Other Ambulatory Visit: Payer: Self-pay | Admitting: Urology

## 2020-07-29 DIAGNOSIS — C778 Secondary and unspecified malignant neoplasm of lymph nodes of multiple regions: Secondary | ICD-10-CM

## 2020-07-29 DIAGNOSIS — C642 Malignant neoplasm of left kidney, except renal pelvis: Secondary | ICD-10-CM

## 2020-08-06 ENCOUNTER — Other Ambulatory Visit: Payer: Self-pay

## 2020-08-06 ENCOUNTER — Ambulatory Visit (HOSPITAL_COMMUNITY)
Admission: RE | Admit: 2020-08-06 | Discharge: 2020-08-06 | Disposition: A | Discharge status: Home / Self Care | Payer: Self-pay | Source: Ambulatory Visit | Attending: Urology | Admitting: Urology

## 2020-08-06 DIAGNOSIS — C778 Secondary and unspecified malignant neoplasm of lymph nodes of multiple regions: Secondary | ICD-10-CM | POA: Insufficient documentation

## 2020-08-06 DIAGNOSIS — C642 Malignant neoplasm of left kidney, except renal pelvis: Secondary | ICD-10-CM | POA: Insufficient documentation

## 2020-08-06 MED ORDER — GADOBUTROL 1 MMOL/ML IV SOLN
7.0000 mL | Freq: Once | INTRAVENOUS | Status: AC | PRN
  Administered 2020-08-06: 7 mL via INTRAVENOUS

## 2020-08-07 ENCOUNTER — Other Ambulatory Visit (HOSPITAL_COMMUNITY): Payer: Self-pay

## 2021-01-21 ENCOUNTER — Telehealth: Payer: Self-pay | Admitting: Oncology

## 2021-01-21 NOTE — Telephone Encounter (Signed)
Received a new pt referral from Dr. Tresa Moore at [facility] for metastatic kidney cancer. [name] has been cld and scheduled to see [name] on 5/20 at [place] is scheduled for two weeks per pt's request.

## 2021-02-05 ENCOUNTER — Inpatient Hospital Stay: Payer: Self-pay | Attending: Oncology | Admitting: Oncology

## 2021-02-05 ENCOUNTER — Other Ambulatory Visit: Payer: Self-pay

## 2021-02-05 VITALS — BP 112/67 | HR 86 | Temp 98.3°F | Resp 17 | Wt 158.9 lb

## 2021-02-05 DIAGNOSIS — I823 Embolism and thrombosis of renal vein: Secondary | ICD-10-CM | POA: Insufficient documentation

## 2021-02-05 DIAGNOSIS — C641 Malignant neoplasm of right kidney, except renal pelvis: Secondary | ICD-10-CM | POA: Insufficient documentation

## 2021-02-05 DIAGNOSIS — Z7901 Long term (current) use of anticoagulants: Secondary | ICD-10-CM | POA: Insufficient documentation

## 2021-02-05 DIAGNOSIS — N1330 Unspecified hydronephrosis: Secondary | ICD-10-CM | POA: Insufficient documentation

## 2021-02-05 DIAGNOSIS — Z87891 Personal history of nicotine dependence: Secondary | ICD-10-CM | POA: Insufficient documentation

## 2021-02-05 DIAGNOSIS — Z8616 Personal history of COVID-19: Secondary | ICD-10-CM | POA: Insufficient documentation

## 2021-02-05 NOTE — Progress Notes (Signed)
Reason for the request:    Kidney cancer  HPI: I was asked by Dr. Tresa Moore to evaluate [name] for the evaluation of kidney cancer.  He is a 65 year old man hospitalized in September 2021 for COVID-19 pneumonitis and acute respiratory failure.  During his evaluation he was found to have left renal mass and hydronephrosis.  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  Tumor extension into the renal pelvis was noted.  Enlarged left periaortic lymph node compatible with metastatic disease.  Based on these findings he was evaluated by Dr. Tresa Moore and felt that his tumor is inoperable.  Clinically, he reports of vague complaints of fatigue weakness and dry cough.  He has reported some lower extremity edema and some weight loss.  His performance status is marginal but not dramatically declined.  He does not report any headaches, blurry vision, syncope or seizures. Does not report any fevers, chills or sweats.  Does not report any cough, wheezing or hemoptysis.  Does not report any chest pain, palpitation, orthopnea or leg edema.  Does not report any nausea, vomiting or abdominal pain.  Does not report any constipation or diarrhea.  Does not report any skeletal complaints.    Does not report frequency, urgency or hematuria.  Does not report any skin rashes or lesions. Does not report any heat or cold intolerance.  Does not report any lymphadenopathy or petechiae.  Does not report any anxiety or depression.  Remaining review of systems is negative.    Past Medical History:  Diagnosis Date  . Alcohol use   . History of tobacco use   . Vapes nicotine containing substance   :  Past Surgical History:  Procedure Laterality Date  . VASECTOMY    :   Current Outpatient Medications:  .  albuterol (VENTOLIN HFA) 108 (90 Base)  MCG/ACT inhaler, Inhale 2 puffs into the lungs every 6 (six) hours as needed for wheezing or shortness of breath., Disp: 6.7 g, Rfl: 0 .  apixaban (ELIQUIS) 2.5 MG TABS tablet, Take 1 tablet (2.5 mg total) by mouth 2 (two) times daily., Disp: 28 tablet, Rfl: 0:  No Known Allergies:  No family history on file.:  Social History   Socioeconomic History  . Marital status: Married    Spouse name: Not on file  . Number of children: Not on file  . Years of education: Not on file  . Highest education level: Not on file  Occupational History  . Not on file  Tobacco Use  . Smoking status: Former Smoker    Types: Cigarettes    Quit date: 2015    Years since quitting: 7.3  . Smokeless tobacco: Never Used  . Tobacco comment: Smoking cigarettes in 2015 and started vaping  Vaping Use  . Vaping Use: Every day  . Substances: Nicotine, Flavoring  Substance and Sexual Activity  . Alcohol use: Yes    Alcohol/week: 14.0 standard drinks    Types: 14 Cans of beer per week  . Drug use: Never  . Sexual activity: Not Currently  Other Topics Concern  . Not on file  Social History Narrative  . Not on file   Social Determinants of Health   Financial Resource Strain: Not on file  Food Insecurity: Not on file  Transportation Needs: Not on file  Physical Activity: Not on file  Stress: Not on file  Social Connections: Not on file  Intimate Partner Violence: Not on file  :  Pertinent items are noted in HPI.  Exam: Blood pressure 112/67, pulse 86, temperature 98.3 F (36.8 C), temperature source Tympanic, resp. rate 17, weight 158 lb 14.4 oz (72.1 kg), SpO2 97 %.   ECOG 1  General appearance: alert and cooperative appeared without distress. Head: atraumatic without any abnormalities. Eyes: conjunctivae/corneas clear. PERRL.  Sclera anicteric. Throat: lips, mucosa, and tongue normal; without oral thrush or ulcers. Resp: clear to auscultation bilaterally without rhonchi, wheezes or dullness  to percussion. Cardio: regular rate and rhythm, S1, S2 normal, no murmur, click, rub or gallop GI: soft, non-tender; bowel sounds normal; no masses,  no organomegaly Skin: Skin color, texture, turgor normal. No rashes or lesions Lymph nodes: Cervical, supraclavicular, and axillary nodes normal. Neurologic: Grossly normal without any motor, sensory or deep tendon reflexes. Musculoskeletal: No joint deformity or effusion.    Assessment and Plan:    65 year old man with:  1.  Large kidney mass involving the left upper and lower pole measuring 10.5 x 11.2 x 13 point centimeter with lymphadenopathy noted on imaging studies in November 2021.  This is suspicious for primary kidney neoplasm that is unresectable.  Differential diagnosis include clear-cell renal cell carcinoma versus other histologies.  Management options moving forward were discussed at this time.  Systemic therapy appears to be the most reasonable approach at this time after completing staging as well as obtaining tissue biopsy.  I recommend proceeding with percutaneous biopsy of his kidney mass or any regional lymphadenopathy to determine histology.  Obtaining pet imaging as well as MRI of the brain with complete his staging work-up.    Once that is completed treatment options will be discussed.  Oral targeted therapy, combination immunotherapy or combination oral targeted therapy with immunotherapy would be best course of action.  Complication associated with his treatments and risks and benefits of this approach was discussed.  Treatment goal would be palliative and curative is not obtainable at this time.  Alternative approach would be supportive management only which will result in further cancer growth and eventually deterioration of his health and necessitating hospice enrollment.  He is not sure how to proceed at this time he is concerned about the cost of care and the fact that there is no cure for his malignancy.  He will  consider these options and let me know if he was to proceed with a more aggressive approach rather than a palliative approach.  2.  Follow-up: Will be determined pending his decision how to proceed.   60  minutes were dedicated to this visit. The time was spent on reviewing laboratory data, imaging studies, discussing treatment options, discussing differential diagnosis and answering questions regarding future plan.     A copy of this consult has been forwarded to the requesting physician.

## 2021-02-09 ENCOUNTER — Ambulatory Visit (INDEPENDENT_AMBULATORY_CARE_PROVIDER_SITE_OTHER): Payer: Self-pay

## 2021-02-09 ENCOUNTER — Other Ambulatory Visit: Payer: Self-pay

## 2021-02-09 ENCOUNTER — Ambulatory Visit (HOSPITAL_COMMUNITY)
Admission: EM | Admit: 2021-02-09 | Discharge: 2021-02-09 | Disposition: A | Discharge status: Home / Self Care | Payer: Self-pay | Attending: Emergency Medicine | Admitting: Emergency Medicine

## 2021-02-09 ENCOUNTER — Encounter (HOSPITAL_COMMUNITY): Payer: Self-pay | Admitting: Emergency Medicine

## 2021-02-09 DIAGNOSIS — R0602 Shortness of breath: Secondary | ICD-10-CM

## 2021-02-09 DIAGNOSIS — J189 Pneumonia, unspecified organism: Secondary | ICD-10-CM

## 2021-02-09 DIAGNOSIS — J90 Pleural effusion, not elsewhere classified: Secondary | ICD-10-CM

## 2021-02-09 MED ORDER — AMOXICILLIN-POT CLAVULANATE 875-125 MG PO TABS: 1.0000 | ORAL_TABLET | Freq: Two times a day (BID) | ORAL | 0 refills | Status: DC

## 2021-02-09 MED ORDER — GUAIFENESIN-DM 100-10 MG/5ML PO SYRP: 5.0000 mL | ORAL_SOLUTION | ORAL | 0 refills | Status: DC | PRN

## 2021-02-09 NOTE — ED Triage Notes (Addendum)
Pt presents with SOB on exertion and dry cough xs 1 week.

## 2021-02-09 NOTE — Discharge Instructions (Addendum)
Your chest x-ray today showed a pleural effusion which means there is some fluid building up in the lining of your lungs, there is also a possible infiltrate which can be a sign of infection, for this reason you will be started on antibiotics   Take Augmentin twice a day for 7 days  Can use 5 mL of cough syrup every 4 hours as needed    Follow up with Urgent Care in 1 week if no improvement with shortness of breath  If shortness of breath gets worse, you begin to have trouble breathing ,chest pain begins, fever or chills begin Please go to the nearest emergency department   A primary care referral was placed to help you find a doctor

## 2021-02-09 NOTE — ED Provider Notes (Signed)
[facility]    CSN: [id number] Arrival date & time: 02/09/21  1446      History   Chief Complaint Chief Complaint  Patient presents with  . Cough  . Shortness of Breath    HPI [name] is a 65 y.o. male.   Patient presents with non productive cough and shortness of breath with exertion for 3 weeks that has worsened over the past week. Cough is worse at nighttime and when getting up from lying position. Patient becomes short of breath with minimal exertion. Became winded from exam room to bathroom next door. Denies fever, chills, chest pain, chest pressure, palpitations, wheezing, edema. Has not attempted treatment. Recent diagnosis of neoplasm of kidney.     Past Medical History:  Diagnosis Date  . Alcohol use   . History of tobacco use   . Vapes nicotine containing substance     Patient Active Problem List   Diagnosis Date Noted  . Pneumonia due to COVID-19 virus 05/22/2020  . Elevated troponin 05/22/2020  . Hypoalbuminemia 05/22/2020  . Microcytic hypochromic anemia 05/22/2020  . Acute respiratory failure with hypoxia ([place]) 05/22/2020  . Hyponatremia 05/22/2020  . Hypochloremia 05/22/2020  . Elevated AST (SGOT) 05/22/2020    Past Surgical History:  Procedure Laterality Date  . VASECTOMY         Home Medications    Prior to Admission medications   Medication Sig Start Date End Date Taking? Authorizing Provider  amoxicillin-clavulanate (AUGMENTIN) 875-125 MG tablet Take 1 tablet by mouth every 12 (twelve) hours. 02/09/21  Yes Dyke Weible R, NP  guaiFENesin-dextromethorphan (ROBITUSSIN DM) 100-10 MG/5ML syrup Take 5 mLs by mouth every 4 (four) hours as needed for cough. 02/09/21  Yes Bruk Tumolo, Leitha Schuller, NP  Acetaminophen (TYLENOL ARTHRITIS EXT RELIEF PO) Take by mouth as needed.    [provider]  albuterol (VENTOLIN HFA) 108 (90 Base) MCG/ACT inhaler Inhale 2 puffs into the lungs every 6 (six) hours as needed for wheezing or  shortness of breath. Patient not taking: Reported on 02/05/2021 05/26/20   Thurnell Lose, MD  apixaban (ELIQUIS) 2.5 MG TABS tablet Take 1 tablet (2.5 mg total) by mouth 2 (two) times daily. Patient not taking: Reported on 02/05/2021 05/26/20   Thurnell Lose, MD  UNABLE TO FIND CBD Delta 8    [provider]    Family History History reviewed. No pertinent family history.  Social History Social History   Tobacco Use  . Smoking status: Former Smoker    Types: Cigarettes    Quit date: 2015    Years since quitting: 7.3  . Smokeless tobacco: Never Used  . Tobacco comment: Smoking cigarettes in 2015 and started vaping  Vaping Use  . Vaping Use: Every day  . Substances: Nicotine, Flavoring  Substance Use Topics  . Alcohol use: Yes    Alcohol/week: 14.0 standard drinks    Types: 14 Cans of beer per week  . Drug use: Never     Allergies   Patient has no known allergies.   Review of Systems Review of Systems  Constitutional: Negative.   HENT: Negative.   Respiratory: Positive for cough and shortness of breath. Negative for apnea, choking, chest tightness, wheezing and stridor.   Cardiovascular: Negative.   Musculoskeletal: Negative.   Skin: Negative.   Neurological: Negative.      Physical Exam Triage Vital Signs ED Triage Vitals  Enc Vitals Group     BP 02/09/21 1554 110/63  Pulse Rate 02/09/21 1554 79     Resp 02/09/21 1554 19     Temp 02/09/21 1554 97.6 F (36.4 C)     Temp Source 02/09/21 1554 Oral     SpO2 02/09/21 1554 100 %     Weight --      Height --      Head Circumference --      Peak Flow --      Pain Score 02/09/21 1552 0     Pain Loc --      Pain Edu? --      Excl. in [place]? --    No data found.  Updated Vital Signs BP 110/63 (BP Location: Right Arm)   Pulse 79   Temp 97.6 F (36.4 C) (Oral)   Resp 19   SpO2 100%   Visual Acuity Right Eye Distance:   Left Eye Distance:   Bilateral Distance:    Right Eye Near:   Left  Eye Near:    Bilateral Near:     Physical Exam Constitutional:      Appearance: Normal appearance. He is well-developed and normal weight.  HENT:     Head: Normocephalic.  Eyes:     Extraocular Movements: Extraocular movements intact.  Cardiovascular:     Rate and Rhythm: Normal rate and regular rhythm.     Pulses: Normal pulses.     Heart sounds: Normal heart sounds.  Pulmonary:     Effort: Pulmonary effort is normal.     Breath sounds: Normal breath sounds.  Musculoskeletal:     Cervical back: Normal range of motion and neck supple.  Skin:    General: Skin is warm and dry.  Neurological:     General: No focal deficit present.     Mental Status: He is alert and oriented to person, place, and time. Mental status is at baseline.  Psychiatric:        Mood and Affect: Mood normal.        Behavior: Behavior normal.        Thought Content: Thought content normal.        Judgment: Judgment normal.      UC Treatments / Results  Labs (all labs ordered are listed, but only abnormal results are displayed) Labs Reviewed - No data to display  EKG   Radiology DG Chest 2 View  Result Date: 02/09/2021 CLINICAL DATA:  65 year old male with shortness of breath. EXAM: CHEST - 2 VIEW COMPARISON:  Chest radiograph dated 05/24/2020. FINDINGS: Evaluation is limited due to suboptimal positioning. There is a moderate size right pleural effusion with right lung base atelectasis or infiltrate. This is new compared to prior radiograph. There is background of chronic interstitial coarsening and emphysema. No pneumothorax. Stable cardiomediastinal silhouette. No acute osseous pathology. IMPRESSION: Moderate right pleural effusion with right lung base atelectasis or infiltrate, new compared to prior radiograph. Electronically Signed   By: Anner Crete M.D.   On: 02/09/2021 16:50    Procedures Procedures (including critical care time)  Medications Ordered in UC Medications - No data to  display  Initial Impression / Assessment and Plan / UC Course  I have reviewed the triage vital signs and the nursing notes.  Pertinent labs & imaging results that were available during my care of the patient were reviewed by me and considered in my medical decision making (see chart for details).   Community acquired pneumonia RLL  Pleural effusion   1. Chest x-ray- pleural effusion, actelactasis vs  infiltrate 2. Augmentin 875-125 bid for 7 days 3. Follow up in 1 week with UC if symptoms persist has no PCP, referral placed 4. Strict precautions given verbally and written for evaluation in emergency department  5. Robitussin DM 100-10 5 mL every 4 hours prn  Final Clinical Impressions(s) / UC Diagnoses   Final diagnoses:  Pleural effusion  Community acquired pneumonia of right lower lobe of lung     Discharge Instructions     Your chest x-ray today showed a pleural effusion which means there is some fluid building up in the lining of your lungs, there is also a possible infiltrate which can be a sign of infection, for this reason you will be started on antibiotics   Take Augmentin twice a day for 7 days  Can use 5 mL of cough syrup every 4 hours as needed    Follow up with Urgent Care in 1 week if no improvement with shortness of breath  If shortness of breath gets worse, you begin to have trouble breathing ,chest pain begins, fever or chills begin Please go to the nearest emergency department   A primary care referral was placed to help you find a doctor      ED Prescriptions    Medication Sig Dispense Auth. Provider   amoxicillin-clavulanate (AUGMENTIN) 875-125 MG tablet Take 1 tablet by mouth every 12 (twelve) hours. 14 tablet Oliverio Cho R, NP   guaiFENesin-dextromethorphan (ROBITUSSIN DM) 100-10 MG/5ML syrup Take 5 mLs by mouth every 4 (four) hours as needed for cough. 118 mL Olen Eaves, Leitha Schuller, NP     PDMP not reviewed this encounter.   Hans [place],  [place] 02/09/21 [phone]

## 2021-02-24 ENCOUNTER — Telehealth: Payer: Self-pay

## 2021-02-24 NOTE — Telephone Encounter (Signed)
Spoke with patient's wife Eustaquio Maize and scheduled an in-person Palliative Consult for 03/03/21 @ 12:30PM.  COVID screening was negative. Three dogs in the home will be put away. Patient lives with wife.   Consent obtained; updated Outlook/Netsmart/Team List and Epic.   Family is aware they may be receiving a call from NP the day before or day of to confirm appointment.

## 2021-03-03 ENCOUNTER — Other Ambulatory Visit: Payer: Self-pay | Admitting: Hospice

## 2021-03-03 ENCOUNTER — Other Ambulatory Visit: Payer: Self-pay

## 2021-03-03 DIAGNOSIS — Z515 Encounter for palliative care: Secondary | ICD-10-CM

## 2021-03-03 DIAGNOSIS — G8929 Other chronic pain: Secondary | ICD-10-CM

## 2021-03-03 DIAGNOSIS — C649 Malignant neoplasm of unspecified kidney, except renal pelvis: Secondary | ICD-10-CM

## 2021-03-03 NOTE — Progress Notes (Signed)
[name] Consult Note Telephone: [phone]  Fax: [phone]  PATIENT NAME: [name] [address] [place] [address] [phone] (home)  DOB: Dec 29, 1955 MRN: [id number]  PRIMARY CARE PROVIDER:    Alexis Frock, MD  REFERRING PROVIDER:   Alexis Frock, MD [address] [place],  [place] [address] [phone]  RESPONSIBLE PARTY:   Self/spouse Contact Information     Name Relation Home Work Mobile   [name],[name] Spouse   [phone]   [name],[name] Daughter   [phone]        I met face to face with patient and family at home. Palliative Care was asked to follow this patient by consultation request of  Gregory Frock, MD to address advance care planning, complex medical decision making and goals of care clarification.  [name] is at home with patient during visit.  This is the initial visit.    ASSESSMENT AND / RECOMMENDATIONS:   Advance Care Planning: Our advance care planning conversation included a discussion about:    The value and importance of advance care planning  Difference between Hospice and Palliative care Exploration of goals of care in the event of a sudden injury or illness  Identification and preparation of a healthcare agent  Review and updating or creation of an  advance directive document . Decision not to resuscitate or to de-escalate disease focused treatments due to poor prognosis.  CODE STATUS: Discussion on implications and ramifications of CODE STATUS.  Patient affirmed he is a DO NOT RESUSCITATE.  NP signed DNR form for patient to keep at home; same document uploaded to epic today.  Goals of Care: Goals include to maximize quality of life and symptom management. Patient wishes to have comfort care; wishes to remain at home for end of life care.  Spouse contemplating when to accept hospice service.  I spent 46 minutes providing this initial consultation. More than 50% of  the time in this consultation was spent on counseling patient and coordinating communication. --------------------------------------------------------------------------------------------------------------------------------------  Symptom Management/Plan: Metastatic Kidney CA: Patient not willing to do Chemo/radiation. Supportive care only Pneumonia: Antibiotics completed.  cough persists. Recommendation - Mucinex 626m BID x 5 days.  Pain in Right low back managed with Oxycodone acetaminophen 5-325 mg every 6 hours as needed for pain.  Routine CBC BMP Constipation: Colace BID, Senna S 8.668mBID, Miralax daily. Back off with diarrhea Insomnia: 50m59maily at bedtime.  Follow up: Palliative care will continue to follow for complex medical decision making, advance care planning, and clarification of goals. Return 3 weeks or prn.Encouraged to call provider sooner with any concerns.   Family /Caregiver/Community Supports: Patient lives at home with his wife  PPS: 50%  HOSPICE ELIGIBILITY/DIAGNOSIS: TBD  Chief Complaint: Initial Palliative care visit:Kidney CA  HISTORY OF PRESENT ILLNESS:  [name] a 64 40o. year old male  with multiple medical conditions including metastatic kidney cancer diagnosed last year, worsening with associated weakness and ongoing weight loss which impairs his independence.  Resting is helpful; weakness is worse as the day progresses.  Patient chooses not to have chemo radiation; spouse reports patient do not have health insurance and they have to pay out-of-pocket for everything.  Limited funds factored into his healthcare decision.  Patient with history of pneumonia last month, due to COVID-19 infection. History obtained from review of EMR, discussion with primary team, caregiver, family and/or [name].  Review and summarization of Epic records shows history from other than patient. Rest of  10 point ROS asked and negative.     Review of lab tests/diagnostics    Results for Gregory, Harding (MRN [id number]) as of 03/03/2021 15:01  Ref. Range 05/26/2020 04:22  Sodium Latest Ref Range: 135 - 145 mmol/L 143  Potassium Latest Ref Range: 3.5 - 5.1 mmol/L 4.0  Chloride Latest Ref Range: 98 - 111 mmol/L 109  CO2 Latest Ref Range: 22 - 32 mmol/L 23  Glucose Latest Ref Range: 70 - 99 mg/dL 115 (H)  BUN Latest Ref Range: 8 - 23 mg/dL 50 (H)  Creatinine Latest Ref Range: 0.61 - 1.24 mg/dL 1.83 (H)  Calcium Latest Ref Range: 8.9 - 10.3 mg/dL 8.6 (L)  Anion gap Latest Ref Range: 5 - 15  11  Phosphorus Latest Ref Range: 2.5 - 4.6 mg/dL 3.1  Magnesium Latest Ref Range: 1.7 - 2.4 mg/dL 2.2  Alkaline Phosphatase Latest Ref Range: 38 - 126 U/L 31 (L)  Albumin Latest Ref Range: 3.5 - 5.0 g/dL 1.9 (L)  AST Latest Ref Range: 15 - 41 U/L 36  ALT Latest Ref Range: 0 - 44 U/L 50 (H)  Total Protein Latest Ref Range: 6.5 - 8.1 g/dL 5.4 (L)  Total Bilirubin Latest Ref Range: 0.3 - 1.2 mg/dL 0.7  GFR, Est Non African American Latest Ref Range: >60 mL/min 38 (L)  GFR, Est African American Latest Ref Range: >60 mL/min 44 (L)    ROS General: NAD EYES: denies vision changes ENMT: denies dysphagia Cardiovascular: denies chest pain/discomfort Pulmonary:denies SOB Abdomen: endorses fair appetite, constipation GU: denies dysuria, urinary frequency MSK: Endorses weakness,  no falls reported Skin: denies rashes or wounds Neurological: denies pain, denies insomnia Psych: Endorses positive mood Heme/lymph/immuno: denies bruises, abnormal bleeding  Physical Exam:  BP 120/70 oxygen 97% room air, pulse 70 respiration 18, T 97.6 Height/Weight 5 feet 9 inches/158  Constitutional: NAD General: Well groomed, cooperative EYES: anicteric sclera, lids intact, no discharge  ENMT: Moist mucous membrane CV: S1 S2, RRR, no LE edema Pulmonary: Rhonchi that cleared with coughing, no wheezing, no increased work of breathing, cough, Abdomen: active BS + 4 quadrants, soft and non  tender GU: no suprapubic tenderness MSK: weakness, sarcopenia, limited ROM Skin: warm and dry, no rashes or wounds on visible skin Neuro:  weakness, otherwise non focal, memory loss Psych: non-anxious affect Hem/lymph/immuno: no widespread bruising   PAST MEDICAL HISTORY:  Active Ambulatory Problems    Diagnosis Date Noted   Pneumonia due to COVID-19 virus 05/22/2020   Elevated troponin 05/22/2020   Hypoalbuminemia 05/22/2020   Microcytic hypochromic anemia 05/22/2020   Acute respiratory failure with hypoxia ([facility]) 05/22/2020   Hyponatremia 05/22/2020   Hypochloremia 05/22/2020   Elevated AST (SGOT) 05/22/2020   Resolved Ambulatory Problems    Diagnosis Date Noted   No Resolved Ambulatory Problems   Past Medical History:  Diagnosis Date   Alcohol use    History of tobacco use    Vapes nicotine containing substance     SOCIAL HX:  Social History   Tobacco Use   Smoking status: Former    Pack years: 0.00    Types: Cigarettes    Quit date: 2015    Years since quitting: 7.4   Smokeless tobacco: Never   Tobacco comments:    Smoking cigarettes in 2015 and started vaping  Substance Use Topics   Alcohol use: Yes    Alcohol/week: 14.0 standard drinks    Types: 14 Cans of beer per week     FAMILY HX: No family  history on file.    ALLERGIES: No Known Allergies    PERTINENT MEDICATIONS:  Outpatient Encounter Medications as of 03/03/2021  Medication Sig   Acetaminophen (TYLENOL ARTHRITIS EXT RELIEF PO) Take by mouth as needed.   albuterol (VENTOLIN HFA) 108 (90 Base) MCG/ACT inhaler Inhale 2 puffs into the lungs every 6 (six) hours as needed for wheezing or shortness of breath. (Patient not taking: Reported on 02/05/2021)   amoxicillin-clavulanate (AUGMENTIN) 875-125 MG tablet Take 1 tablet by mouth every 12 (twelve) hours.   apixaban (ELIQUIS) 2.5 MG TABS tablet Take 1 tablet (2.5 mg total) by mouth 2 (two) times daily. (Patient not taking: Reported on 02/05/2021)    guaiFENesin-dextromethorphan (ROBITUSSIN DM) 100-10 MG/5ML syrup Take 5 mLs by mouth every 4 (four) hours as needed for cough.   UNABLE TO FIND CBD Delta 8   No facility-administered encounter medications on file as of 03/03/2021.     Thank you for the opportunity to participate in the care of [name].  The palliative care team will continue to follow. Please call our office at [phone] if we can be of additional assistance.   Note: Portions of this note were generated with [employer]. Dictation errors may occur despite best attempts at proofreading.  Teodoro Spray, NP

## 2021-03-08 ENCOUNTER — Telehealth: Payer: Self-pay

## 2021-03-08 NOTE — Telephone Encounter (Signed)
Received message to call patient's wife, Eustaquio Maize. Beth shared that patient has been declining since [place] NP saw him on 03/03/21. Patient is more confused and wandering. Patient is not eating and will only take sips when offered. Patient has been falling throughout the weekend. Beth stated she is ready for patient to transition to Hospice services and stated that Dr. Tresa Moore had already said he was in agreement with this. Will f/u with Palliative NP and Hospice Referral Dept to confirm that there is an order for hospice eval

## 2021-03-19 DEATH — deceased

## 2021-07-08 IMAGING — DX DG CHEST 1V PORT
1 series · 1 of 1 positions shown · non-contrast
Comparison: A prior chest x-ray 05/23/2020

CLINICAL DATA: [id number] pneumonia

EXAM:
PORTABLE CHEST 1 VIEW

[chest]
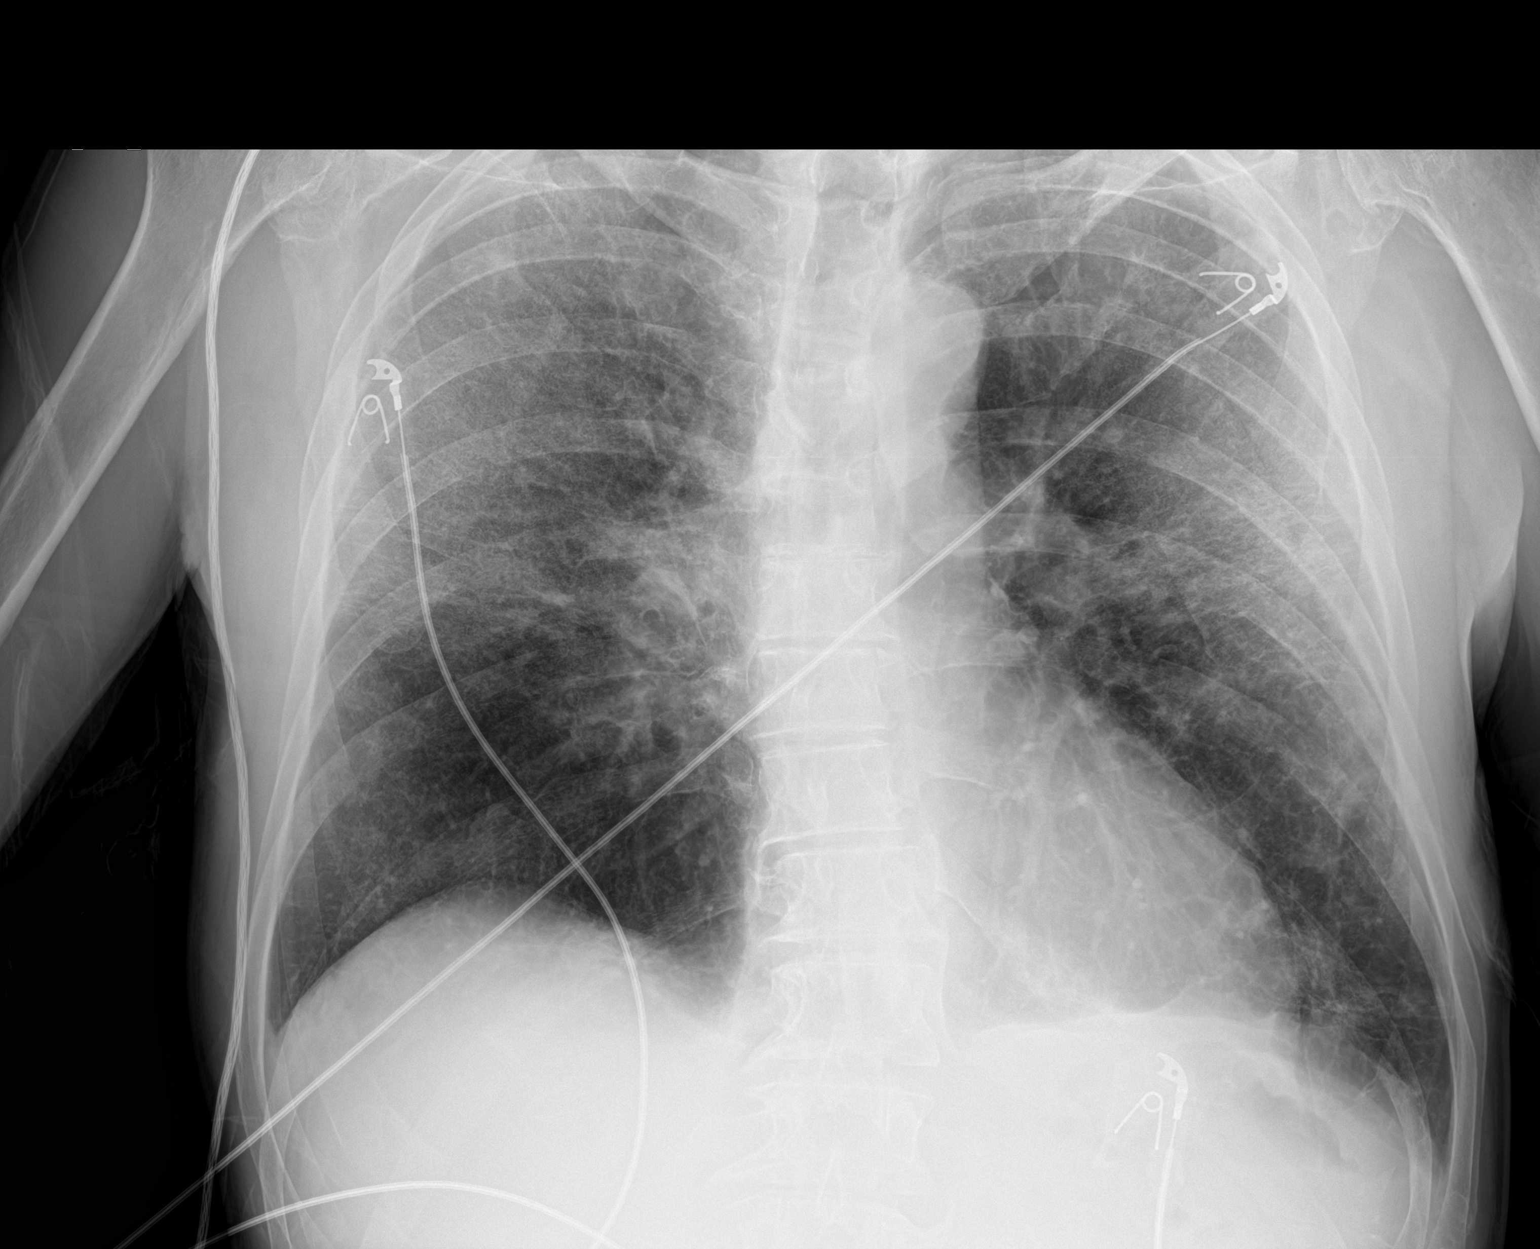

[1 of 1 positions shown; findings below may reference images not displayed]

FINDINGS: Stable appearance of the lungs with patchy airspace opacities in the
bilateral upper and mid lungs. This is superimposed on a background
of chronic bronchitic and emphysematous changes. Cardiac and
mediastinal contours remain within normal limits. No pneumothorax,
pleural effusion or pulmonary edema.
IMPRESSION: No significant interval change in the appearance of the chest.
Persistent bilateral patchy airspace opacities in the upper and mid
lungs consistent with multifocal COVID pneumonia.

## 2022-03-26 IMAGING — DX DG CHEST 2V
2 series · 2 of 2 positions shown · non-contrast
Comparison: Chest radiograph dated 05/24/2020.

CLINICAL DATA: 64-year-old male with shortness of breath.

EXAM:
CHEST - 2 VIEW

[chest pa]
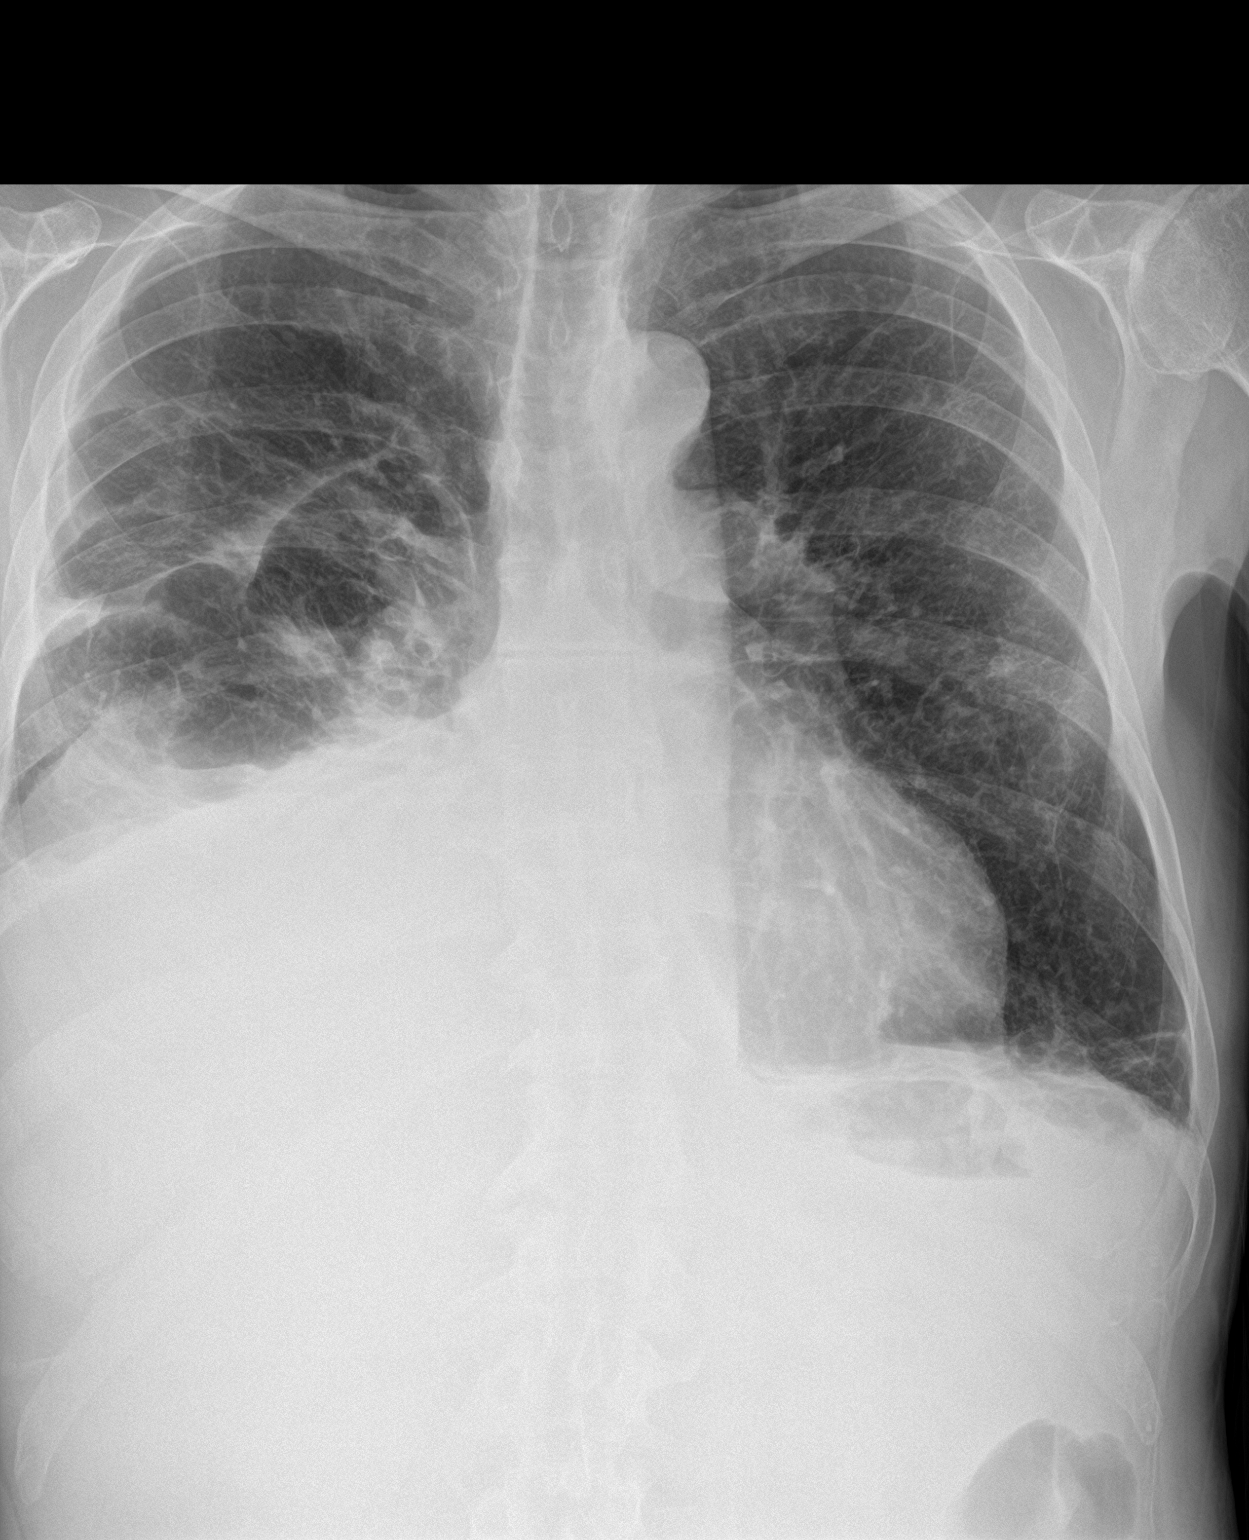

[chest lat]
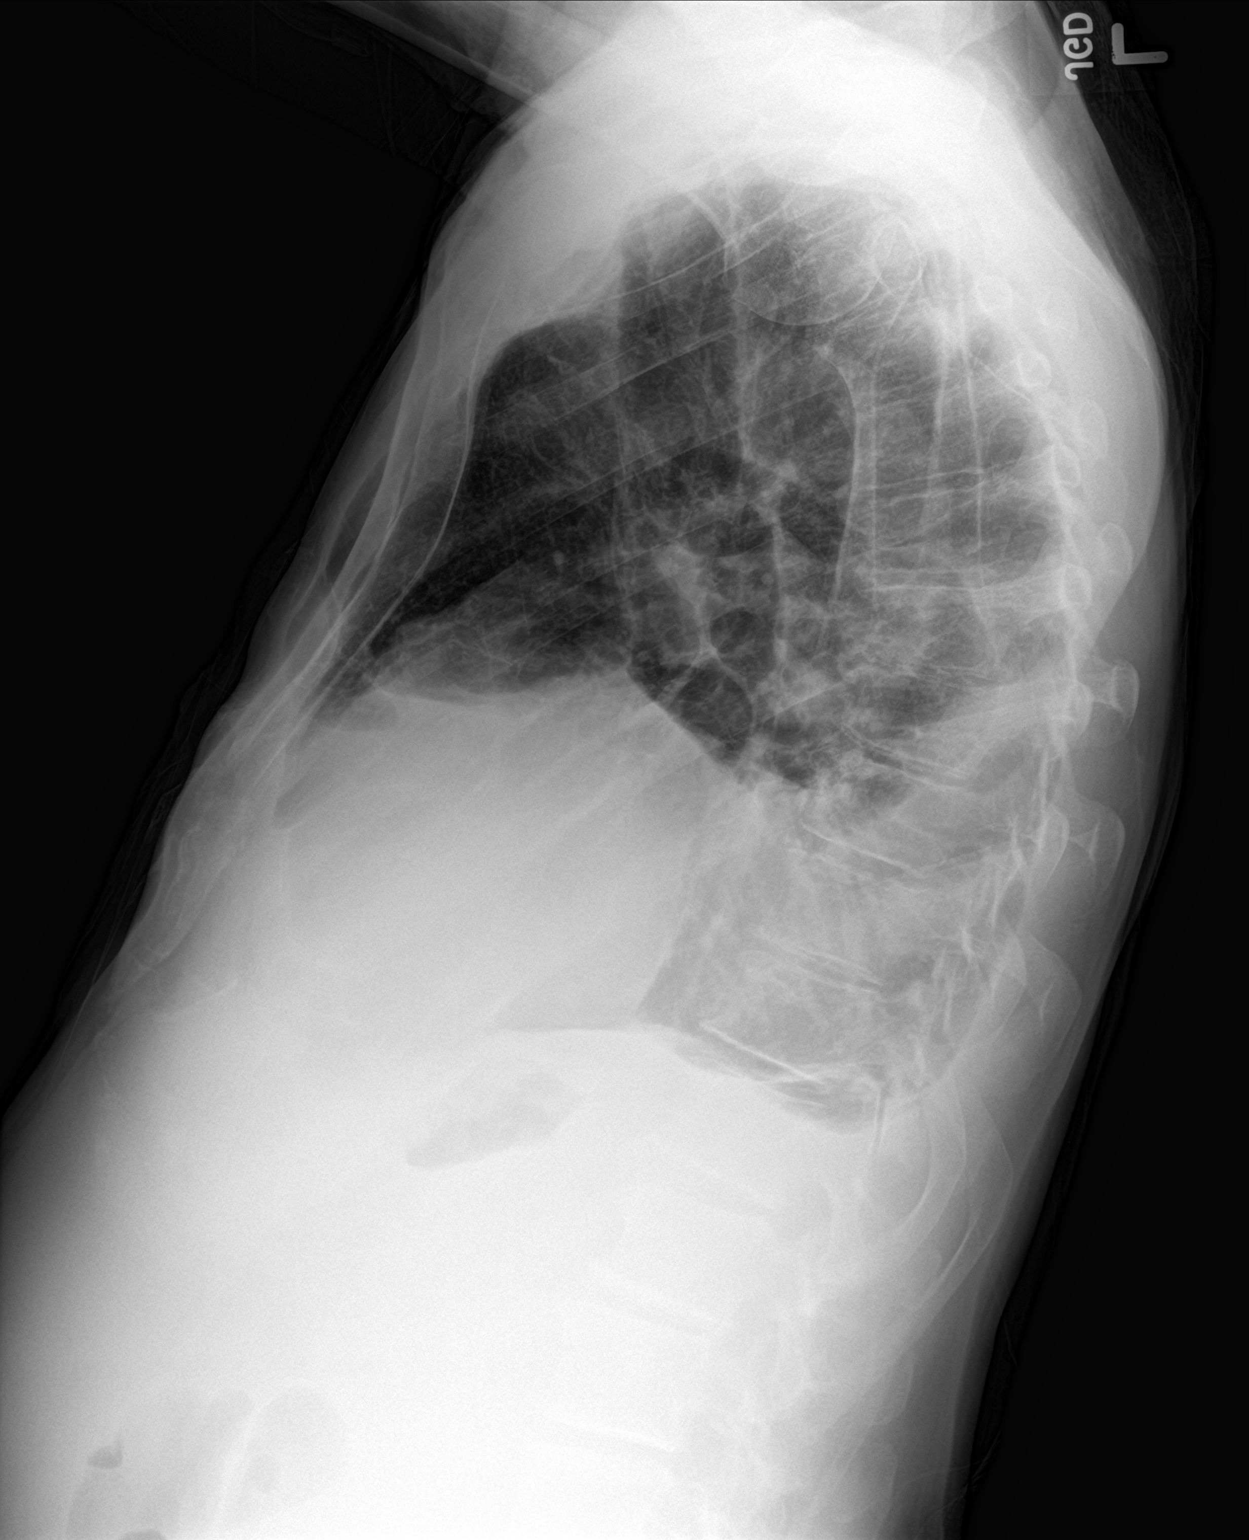

[2 of 2 positions shown; findings below may reference images not displayed]

FINDINGS: Evaluation is limited due to suboptimal positioning. There is a
moderate size right pleural effusion with right lung base
atelectasis or infiltrate. This is new compared to prior radiograph.
There is background of chronic interstitial coarsening and
emphysema. No pneumothorax. Stable cardiomediastinal silhouette. No
acute osseous pathology.
IMPRESSION: Moderate right pleural effusion with right lung base atelectasis or
infiltrate, new compared to prior radiograph.
